# Patient Record
Sex: Female | Born: 1970
Health system: Southern US, Community
[De-identification: ages and names within clinical notes are randomized; demographics above are authoritative.]

## PROBLEM LIST (undated history)

## (undated) DIAGNOSIS — D490 Neoplasm of unspecified behavior of digestive system: Secondary | ICD-10-CM

## (undated) DIAGNOSIS — G43909 Migraine, unspecified, not intractable, without status migrainosus: Secondary | ICD-10-CM

## (undated) DIAGNOSIS — J342 Deviated nasal septum: Secondary | ICD-10-CM

## (undated) DIAGNOSIS — J45998 Other asthma: Secondary | ICD-10-CM

## (undated) DIAGNOSIS — K219 Gastro-esophageal reflux disease without esophagitis: Secondary | ICD-10-CM

## (undated) DIAGNOSIS — R7303 Prediabetes: Secondary | ICD-10-CM

## (undated) DIAGNOSIS — Z9889 Other specified postprocedural states: Secondary | ICD-10-CM

## (undated) DIAGNOSIS — R112 Nausea with vomiting, unspecified: Secondary | ICD-10-CM

## (undated) HISTORY — DX: Prediabetes: R73.03

## (undated) HISTORY — PX: BREAST REDUCTION SURGERY: SHX8

## (undated) HISTORY — DX: Neoplasm of unspecified behavior of digestive system: D49.0

## (undated) HISTORY — DX: Deviated nasal septum: J34.2

---

## 1997-09-02 HISTORY — PX: INGUINAL HERNIA REPAIR: SUR1180

## 1999-09-03 HISTORY — PX: TUBAL LIGATION: SHX77

## 2002-09-02 HISTORY — PX: HAND SURGERY: SHX662

## 2007-07-08 ENCOUNTER — Encounter: Admission: RE | Admit: 2007-07-08 | Discharge: 2007-07-08 | Payer: Self-pay | Admitting: Obstetrics and Gynecology

## 2007-07-28 ENCOUNTER — Encounter: Admission: RE | Admit: 2007-07-28 | Discharge: 2007-07-28 | Payer: Self-pay | Admitting: Obstetrics and Gynecology

## 2007-09-03 HISTORY — PX: ENDOMETRIAL ABLATION: SHX621

## 2008-02-17 ENCOUNTER — Encounter: Admission: RE | Admit: 2008-02-17 | Discharge: 2008-02-17 | Payer: Self-pay | Admitting: Obstetrics and Gynecology

## 2008-05-11 ENCOUNTER — Encounter (INDEPENDENT_AMBULATORY_CARE_PROVIDER_SITE_OTHER): Payer: Self-pay | Admitting: Obstetrics and Gynecology

## 2008-05-11 ENCOUNTER — Ambulatory Visit (HOSPITAL_COMMUNITY): Admission: RE | Admit: 2008-05-11 | Discharge: 2008-05-11 | Payer: Self-pay | Admitting: Obstetrics and Gynecology

## 2009-02-23 ENCOUNTER — Encounter: Admission: RE | Admit: 2009-02-23 | Discharge: 2009-02-23 | Payer: Self-pay | Admitting: Obstetrics and Gynecology

## 2011-01-15 NOTE — Op Note (Signed)
NAMESHANINA, KEPPLE               ACCOUNT NO.:  [id number]   MEDICAL RECORD NO.:  [id number]          PATIENT TYPE:  AMB   LOCATION:  SDC                           FACILITY:  [facility]   PHYSICIAN:  Miguel Aschoff, M.D.       DATE OF BIRTH:  1970-09-28   DATE OF PROCEDURE:  DATE OF DISCHARGE:                               OPERATIVE REPORT   PREOPERATIVE DIAGNOSIS:  Menorrhagia.   POSTOPERATIVE DIAGNOSIS:  Menorrhagia.   PROCEDURES:  Cervical dilatation, hysteroscopy, and uterine curettage  followed by NovaSure endometrial ablation.   SURGEON:  Miguel Aschoff, MD   ANESTHESIA:  General.   COMPLICATIONS:  None.   JUSTIFICATION:  The patient is a 40 year old white female with a history  of very heavy menses, history of prior tubal sterilization.  Because of  the very heavy menses, the patient has requested that a procedure to be  carried out in effort to control her flow.  Options were discussed with  the patient and the various therapies available including Mirena IUD.  She has elected; however, to undergo D&C, hysteroscopy, and endometrial  ablation as primary therapy for her menorrhagia.  Risks and benefits  have been discussed.   PROCEDURE:  The patient was taken to the operating placed in supine  position.  General anesthesia was administered without difficulty.  She  was then placed in dorsal lithotomy position, prepped and draped in the  usual sterile fashion.  Examination under anesthesia revealed normal  external genitalia, normal Bartholin Skene's glands and normal urethra.  The vaginal vault was without gross lesion.  The cervix was without  gross lesion.  The uterus noted be top normal size anterior, no adnexal  masses were noted.  At this point, speculum was placed in the vaginal  vault.  Anterior cervical lip was grasped with the tenaculum.  The  uterus was then sounded to 10 cm.  Following this, the cervical length  of 4 cm was then determined.  At this point, the cervix was  further  dilated using serial Pratt dilators until a #23 Pratt dilator could be  passed.  At this point, the diagnostic hysteroscope was advanced through  the endocervix.  No endocervical lesions were noted.  The endometrial  cavity was unremarkable.  No polyps or submucous myomas were found.  At  this point, one additional dilator was used, a Nutritional therapist 25 and then the  NovaSure endometrial ablation unit was placed into the uterine cavity.  The cavity was 6 cm, the cavity width was 3.7 cm.  A treatment cycle at  122 watts for 117 seconds was carried out without difficulty.  On  completion of the procedure, the instrument was removed intact.  The  scope was reintroduced into the uterus.  The cavity appeared to be well  coagulated.  At this point, the hysteroscope was removed.  The cervix  was injected with 18 mL of 1% Xylocaine by placing equal amounts at 12,  4, and 8 o'clock positions.  Hemostasis was readily achieved.  At this  point, the procedure was completed.  The patient was taken  out of the  lithotomy position, brought to recovery room in satisfactory condition.  Estimated blood loss was minimal.   Plan is for the patient be discharged home.   MEDICATIONS:  For home include, doxycycline one twice a day x3 days and  Darvocet-N 100 every 4 hours need for pain.  She was instructed to place  nothing the vagina, to call if there are any problems such as fever,  pain, or heavy bleeding.      Miguel Aschoff, M.D.  Electronically Signed     AR/MEDQ  D:  05/11/2008  T:  05/11/2008  Job:  [id number]

## 2011-06-05 LAB — CBC
HCT: 39.3
Hemoglobin: 13.1
MCHC: 33.3
MCV: 93.1
RBC: 4.22
WBC: 5.8

## 2014-04-11 ENCOUNTER — Emergency Department (HOSPITAL_COMMUNITY): Payer: 59

## 2014-04-11 ENCOUNTER — Emergency Department (HOSPITAL_COMMUNITY)
Admission: EM | Admit: 2014-04-11 | Discharge: 2014-04-11 | Discharge status: Against Medical Advice | Payer: 59 | Source: Home / Self Care

## 2014-04-11 ENCOUNTER — Encounter (HOSPITAL_COMMUNITY): Payer: Self-pay | Admitting: Emergency Medicine

## 2014-04-11 DIAGNOSIS — R5381 Other malaise: Secondary | ICD-10-CM

## 2014-04-11 DIAGNOSIS — J309 Allergic rhinitis, unspecified: Secondary | ICD-10-CM

## 2014-04-11 DIAGNOSIS — B9789 Other viral agents as the cause of diseases classified elsewhere: Secondary | ICD-10-CM

## 2014-04-11 DIAGNOSIS — R5383 Other fatigue: Secondary | ICD-10-CM

## 2014-04-11 DIAGNOSIS — B349 Viral infection, unspecified: Secondary | ICD-10-CM

## 2014-04-11 HISTORY — DX: Migraine, unspecified, not intractable, without status migrainosus: G43.909

## 2014-04-11 NOTE — Discharge Instructions (Signed)
Fatigue Fatigue is a feeling of tiredness, lack of energy, lack of motivation, or feeling tired all the time. Having enough rest, good nutrition, and reducing stress will normally reduce fatigue. Consult your caregiver if it persists. The nature of your fatigue will help your caregiver to find out its cause. The treatment is based on the cause.  CAUSES  There are many causes for fatigue. Most of the time, fatigue can be traced to one or more of your habits or routines. Most causes fit into one or more of three general areas. They are: Lifestyle problems  Sleep disturbances.  Overwork.  Physical exertion.  Unhealthy habits.  Poor eating habits or eating disorders.  Alcohol and/or drug use .  Lack of proper nutrition (malnutrition). Psychological problems  Stress and/or anxiety problems.  Depression.  Grief.  Boredom. Medical Problems or Conditions  Anemia.  Pregnancy.  Thyroid gland problems.  Recovery from major surgery.  Continuous pain.  Emphysema or asthma that is not well controlled  Allergic conditions.  Diabetes.  Infections (such as mononucleosis).  Obesity.  Sleep disorders, such as sleep apnea.  Heart failure or other heart-related problems.  Cancer.  Kidney disease.  Liver disease.  Effects of certain medicines such as antihistamines, cough and cold remedies, prescription pain medicines, heart and blood pressure medicines, drugs used for treatment of cancer, and some antidepressants. SYMPTOMS  The symptoms of fatigue include:   Lack of energy.  Lack of drive (motivation).  Drowsiness.  Feeling of indifference to the surroundings. DIAGNOSIS  The details of how you feel help guide your caregiver in finding out what is causing the fatigue. You will be asked about your present and past health condition. It is important to review all medicines that you take, including prescription and non-prescription items. A thorough exam will be done.  You will be questioned about your feelings, habits, and normal lifestyle. Your caregiver may suggest blood tests, urine tests, or other tests to look for common medical causes of fatigue.  TREATMENT  Fatigue is treated by correcting the underlying cause. For example, if you have continuous pain or depression, treating these causes will improve how you feel. Similarly, adjusting the dose of certain medicines will help in reducing fatigue.  HOME CARE INSTRUCTIONS   Try to get the required amount of good sleep every night.  Eat a healthy and nutritious diet, and drink enough water throughout the day.  Practice ways of relaxing (including yoga or meditation).  Exercise regularly.  Make plans to change situations that cause stress. Act on those plans so that stresses decrease over time. Keep your work and personal routine reasonable.  Avoid street drugs and minimize use of alcohol.  Start taking a daily multivitamin after consulting your caregiver. SEEK MEDICAL CARE IF:   You have persistent tiredness, which cannot be accounted for.  You have fever.  You have unintentional weight loss.  You have headaches.  You have disturbed sleep throughout the night.  You are feeling sad.  You have constipation.  You have dry skin.  You have gained weight.  You are taking any new or different medicines that you suspect are causing fatigue.  You are unable to sleep at night.  You develop any unusual swelling of your legs or other parts of your body. SEEK IMMEDIATE MEDICAL CARE IF:   You are feeling confused.  Your vision is blurred.  You feel faint or pass out.  You develop severe headache.  You develop severe abdominal, pelvic, or  back pain.  You develop chest pain, shortness of breath, or an irregular or fast heartbeat.  You are unable to pass a normal amount of urine.  You develop abnormal bleeding such as bleeding from the rectum or you vomit blood.  You have thoughts  about harming yourself or committing suicide.  You are worried that you might harm someone else. MAKE SURE YOU:   Understand these instructions.  Will watch your condition.  Will get help right away if you are not doing well or get worse. Document Released: 06/16/2007 Document Revised: 11/11/2011 Document Reviewed: 12/21/2013 [facility] Patient Information 2015 [place], [place]. This information is not intended to replace advice given to you by your health care provider. Make sure you discuss any questions you have with your health care provider.  Viral Infections A viral infection can be caused by different types of viruses.Most viral infections are not serious and resolve on their own. However, some infections may cause severe symptoms and may lead to further complications. SYMPTOMS Viruses can frequently cause:  Minor sore throat.  Aches and pains.  Headaches.  Runny nose.  Different types of rashes.  Watery eyes.  Tiredness.  Cough.  Loss of appetite.  Gastrointestinal infections, resulting in nausea, vomiting, and diarrhea. These symptoms do not respond to antibiotics because the infection is not caused by bacteria. However, you might catch a bacterial infection following the viral infection. This is sometimes called a "superinfection." Symptoms of such a bacterial infection may include:  Worsening sore throat with pus and difficulty swallowing.  Swollen neck glands.  Chills and a high or persistent fever.  Severe headache.  Tenderness over the sinuses.  Persistent overall ill feeling (malaise), muscle aches, and tiredness (fatigue).  Persistent cough.  Yellow, green, or brown mucus production with coughing. HOME CARE INSTRUCTIONS   Only take over-the-counter or prescription medicines for pain, discomfort, diarrhea, or fever as directed by your caregiver.  Drink enough water and fluids to keep your urine clear or pale yellow. Sports drinks can provide  valuable electrolytes, sugars, and hydration.  Get plenty of rest and maintain proper nutrition. Soups and broths with crackers or rice are fine. SEEK IMMEDIATE MEDICAL CARE IF:   You have severe headaches, shortness of breath, chest pain, neck pain, or an unusual rash.  You have uncontrolled vomiting, diarrhea, or you are unable to keep down fluids.  You or your child has an oral temperature above 102 F (38.9 C), not controlled by medicine.  Your baby is older than 3 months with a rectal temperature of 102 F (38.9 C) or higher.  Your baby is 10 months old or younger with a rectal temperature of 100.4 F (38 C) or higher. MAKE SURE YOU:   Understand these instructions.  Will watch your condition.  Will get help right away if you are not doing well or get worse. Document Released: 05/29/2005 Document Revised: 11/11/2011 Document Reviewed: 12/24/2010 [facility] Patient Information 2015 [place], [place]. This information is not intended to replace advice given to you by your health care provider. Make sure you discuss any questions you have with your health care provider.

## 2014-04-11 NOTE — ED Notes (Signed)
Patient made aware that another provider would be coming in to see her.

## 2014-04-11 NOTE — ED Provider Notes (Signed)
CSN: [id number]     Arrival date & time 04/11/14  1352 History   First MD Initiated Contact with Patient 04/11/14 1521     Chief Complaint  Patient presents with  . Headache  . Fever   (Consider location/radiation/quality/duration/timing/severity/associated sxs/prior Treatment) Patient is a 43 y.o. female presenting with fever.  Fever Onset quality:  Gradual Duration:  7 days Progression:  Worsening (feels worse today, except migraine on sat/sun has resolved.) Chronicity:  New Associated symptoms: congestion, cough, headaches, myalgias, nausea and sore throat   Associated symptoms: no chest pain, no diarrhea, no dysuria, no rash and no vomiting   Associated symptoms comment:  No ticks, no travel. Risk factors: no recent travel and no sick contacts     Past Medical History  Diagnosis Date  . Asthma   . Migraines    Past Surgical History  Procedure Laterality Date  . Tubal ligation    . Hernia repair     No family history on file. History  Substance Use Topics  . Smoking status: Never Smoker   . Smokeless tobacco: Not on file  . Alcohol Use: No   OB History   Grav Para Term Preterm Abortions TAB SAB Ect Mult Living                 Review of Systems  Constitutional: Positive for fever.  HENT: Positive for congestion and sore throat.   Respiratory: Positive for cough.   Cardiovascular: Negative for chest pain.  Gastrointestinal: Positive for nausea. Negative for vomiting and diarrhea.  Genitourinary: Negative for dysuria.  Musculoskeletal: Positive for myalgias.  Skin: Negative for rash.  Neurological: Positive for headaches.    Allergies  Review of patient's allergies indicates no known allergies.  Home Medications   Prior to Admission medications   Medication Sig Start Date End Date Taking? Authorizing Provider  Naproxen Sodium (ALEVE PO) Take by mouth.   Yes Historical Provider, MD  SUMAtriptan Succinate (IMITREX PO) Take by mouth.   Yes Historical  Provider, MD   BP 100/68  Pulse 109  Temp(Src) 99.3 F (37.4 C) (Oral)  Resp 16  SpO2 95%  LMP 03/24/2014 Physical Exam  ED Course  Procedures (including critical care time) Labs Review Labs Reviewed - No data to display  Imaging Review No results found.   MDM   1. Viral syndrome   2. Malaise and fatigue   3. Allergic rhinitis, unspecified allergic rhinitis type    Pt requested another provider after initial misunderstanding with me re improvement in her sx when she relates she is actually worse.in some ways.. Pt distraught.Billy Fischer, MD 04/11/14 [phone]

## 2014-04-11 NOTE — ED Notes (Signed)
Reports onset of symptoms last Monday-august 3.  Since onset symptoms have worsened and seemed to peak on Saturday-8/8.  Headache, intermittent migraine, chest soreness, slight abdominal pain, nausea, no vomiting, no diarrhea.  Has felt feverish with joint aches and left flank pain

## 2014-04-11 NOTE — ED Provider Notes (Signed)
Medical screening examination/treatment/procedure(s) were performed by resident physician or non-physician practitioner and as supervising physician I was immediately available for consultation/collaboration.   Pauline Good MD.   Billy Fischer, MD 04/11/14 [phone]

## 2014-04-11 NOTE — ED Notes (Signed)
Notified provider that patient upset and requesting another provider if possible.  Agreed to Janne Napoleon, np evaluating patient

## 2014-04-11 NOTE — ED Provider Notes (Signed)
CSN: [id number]     Arrival date & time 04/11/14  1352 History   First MD Initiated Contact with Patient 04/11/14 1521     Chief Complaint  Patient presents with  . Headache  . Fever   (Consider location/radiation/quality/duration/timing/severity/associated sxs/prior Treatment) HPI Comments: 43 y o f with a myriad of sx's including "feeling bad"for several days, weakness subjective fever, 'hard to breath", some joints hurting, chest congestion, PND and clearing of throat.  When entering room she was sitting in chair crying with an exaggerated expression of dispair.    Past Medical History  Diagnosis Date  . Asthma   . Migraines    Past Surgical History  Procedure Laterality Date  . Tubal ligation    . Hernia repair     No family history on file. History  Substance Use Topics  . Smoking status: Never Smoker   . Smokeless tobacco: Not on file  . Alcohol Use: No   OB History   Grav Para Term Preterm Abortions TAB SAB Ect Mult Living                 Review of Systems  Constitutional: Positive for fever, activity change, appetite change and fatigue.  HENT: Positive for congestion and postnasal drip. Negative for ear pain, facial swelling, sinus pressure, sore throat, tinnitus and trouble swallowing.   Eyes: Negative for visual disturbance.  Respiratory: Negative for cough, choking and wheezing.   Cardiovascular: Negative for chest pain and leg swelling.  Gastrointestinal: Positive for nausea. Negative for vomiting.       When she had her migraine headache earlier this weekend.  Genitourinary: Negative.   Musculoskeletal: Positive for arthralgias and back pain. Negative for gait problem, joint swelling, myalgias, neck pain and neck stiffness.  Skin: Negative for rash.  Neurological: Positive for weakness and headaches. Negative for dizziness.  Psychiatric/Behavioral: The patient is nervous/anxious.     Allergies  Review of patient's allergies indicates no known  allergies.  Home Medications   Prior to Admission medications   Medication Sig Start Date End Date Taking? Authorizing Provider  Naproxen Sodium (ALEVE PO) Take by mouth.   Yes Historical Provider, MD  SUMAtriptan Succinate (IMITREX PO) Take by mouth.   Yes Historical Provider, MD   BP 100/68  Pulse 109  Temp(Src) 99.3 F (37.4 C) (Oral)  Resp 16  SpO2 95%  LMP 03/24/2014 Physical Exam  Nursing note and vitals reviewed. Constitutional: She is oriented to person, place, and time. She appears well-developed and well-nourished. She appears distressed.  HENT:  Right Ear: External ear normal.  Left Ear: External ear normal.  Mouth/Throat: No oropharyngeal exudate.  OP with coblestoning and scant clear PND.  Eyes: EOM are normal. Pupils are equal, round, and reactive to light.  Neck: Normal range of motion. Neck supple.  No spinal or paracervical tenderness. FUll rom.  Cardiovascular: Normal rate, regular rhythm, normal heart sounds and intact distal pulses.   No murmur heard. Pulmonary/Chest: Effort normal and breath sounds normal. No respiratory distress. She has no wheezes. She has no rales.  Abdominal: Soft. There is no tenderness.  Musculoskeletal: Normal range of motion. She exhibits no edema and no tenderness.  Lymphadenopathy:    She has no cervical adenopathy.  Neurological: She is alert and oriented to person, place, and time. She exhibits normal muscle tone.  Skin: Skin is warm and dry.  Psychiatric: Her speech is normal. Judgment and thought content normal. Her mood appears anxious. Cognition and memory are  normal. She exhibits a depressed mood.    ED Course  Procedures (including critical care time) Labs Review Labs Reviewed - No data to display  Imaging Review No results found.   MDM   1. Viral syndrome   2. Malaise and fatigue   3. Allergic rhinitis, unspecified allergic rhinitis type    Recommended rest, fluids. Antihistamines for drainage Motrin for  achiness Try your albuterol HFA for any shortness of breath. Pt was asked to wait to receive instructions but she walked out when I left the room     Janne Napoleon, NP 04/11/14 1625

## 2014-04-11 NOTE — ED Notes (Signed)
Patient walked out of building without speaking to nursing staff

## 2015-02-17 ENCOUNTER — Other Ambulatory Visit: Payer: Self-pay | Admitting: Family Medicine

## 2015-02-17 DIAGNOSIS — Z1231 Encounter for screening mammogram for malignant neoplasm of breast: Secondary | ICD-10-CM

## 2015-02-23 ENCOUNTER — Ambulatory Visit: Payer: Self-pay

## 2015-08-24 ENCOUNTER — Ambulatory Visit
Admission: RE | Admit: 2015-08-24 | Discharge: 2015-08-24 | Disposition: A | Discharge status: Home / Self Care | Payer: Commercial Managed Care - HMO | Source: Ambulatory Visit | Attending: Family Medicine | Admitting: Family Medicine

## 2015-08-24 DIAGNOSIS — Z1231 Encounter for screening mammogram for malignant neoplasm of breast: Secondary | ICD-10-CM

## 2015-08-29 ENCOUNTER — Other Ambulatory Visit: Payer: Self-pay | Admitting: Family Medicine

## 2015-08-29 DIAGNOSIS — R928 Other abnormal and inconclusive findings on diagnostic imaging of breast: Secondary | ICD-10-CM

## 2015-09-07 ENCOUNTER — Ambulatory Visit
Admission: RE | Admit: 2015-09-07 | Discharge: 2015-09-07 | Disposition: A | Discharge status: Home / Self Care | Payer: Commercial Managed Care - HMO | Source: Ambulatory Visit | Attending: Family Medicine | Admitting: Family Medicine

## 2015-09-07 DIAGNOSIS — R928 Other abnormal and inconclusive findings on diagnostic imaging of breast: Secondary | ICD-10-CM

## 2016-01-17 ENCOUNTER — Other Ambulatory Visit: Payer: Self-pay | Admitting: Family Medicine

## 2016-01-17 DIAGNOSIS — R221 Localized swelling, mass and lump, neck: Secondary | ICD-10-CM

## 2016-01-19 ENCOUNTER — Ambulatory Visit
Admission: RE | Admit: 2016-01-19 | Discharge: 2016-01-19 | Disposition: A | Discharge status: Home / Self Care | Payer: 59 | Source: Ambulatory Visit | Attending: Family Medicine | Admitting: Family Medicine

## 2016-01-19 DIAGNOSIS — R221 Localized swelling, mass and lump, neck: Secondary | ICD-10-CM

## 2016-02-01 ENCOUNTER — Other Ambulatory Visit: Payer: Self-pay | Admitting: Family Medicine

## 2016-02-01 DIAGNOSIS — K118 Other diseases of salivary glands: Secondary | ICD-10-CM

## 2016-02-08 ENCOUNTER — Other Ambulatory Visit: Payer: 59

## 2016-02-23 ENCOUNTER — Inpatient Hospital Stay: Admission: RE | Admit: 2016-02-23 | Payer: 59 | Source: Ambulatory Visit

## 2016-02-29 ENCOUNTER — Ambulatory Visit
Admission: RE | Admit: 2016-02-29 | Discharge: 2016-02-29 | Disposition: A | Discharge status: Home / Self Care | Payer: 59 | Source: Ambulatory Visit | Attending: Family Medicine | Admitting: Family Medicine

## 2016-02-29 DIAGNOSIS — K118 Other diseases of salivary glands: Secondary | ICD-10-CM

## 2016-02-29 MED ORDER — IOPAMIDOL (ISOVUE-300) INJECTION 61%
75.0000 mL | Freq: Once | INTRAVENOUS | Status: AC | PRN
  Administered 2016-02-29: 75 mL via INTRAVENOUS

## 2016-05-21 ENCOUNTER — Ambulatory Visit (INDEPENDENT_AMBULATORY_CARE_PROVIDER_SITE_OTHER): Payer: 59 | Admitting: Allergy and Immunology

## 2016-05-21 ENCOUNTER — Encounter: Payer: Self-pay | Admitting: Allergy and Immunology

## 2016-05-21 ENCOUNTER — Encounter (INDEPENDENT_AMBULATORY_CARE_PROVIDER_SITE_OTHER): Payer: Self-pay

## 2016-05-21 VITALS — BP 104/70 | HR 80 | Temp 98.1°F | Resp 16 | Ht 63.7 in | Wt 153.6 lb

## 2016-05-21 DIAGNOSIS — J309 Allergic rhinitis, unspecified: Secondary | ICD-10-CM | POA: Diagnosis not present

## 2016-05-21 DIAGNOSIS — J387 Other diseases of larynx: Secondary | ICD-10-CM

## 2016-05-21 DIAGNOSIS — H1010 Acute atopic conjunctivitis, unspecified eye: Secondary | ICD-10-CM

## 2016-05-21 DIAGNOSIS — G43909 Migraine, unspecified, not intractable, without status migrainosus: Secondary | ICD-10-CM

## 2016-05-21 DIAGNOSIS — J4520 Mild intermittent asthma, uncomplicated: Secondary | ICD-10-CM | POA: Diagnosis not present

## 2016-05-21 DIAGNOSIS — K219 Gastro-esophageal reflux disease without esophagitis: Secondary | ICD-10-CM

## 2016-05-21 DIAGNOSIS — G43109 Migraine with aura, not intractable, without status migrainosus: Secondary | ICD-10-CM

## 2016-05-21 MED ORDER — DEXLANSOPRAZOLE 60 MG PO CPDR: DELAYED_RELEASE_CAPSULE | ORAL | 5 refills | Status: DC

## 2016-05-21 MED ORDER — RANITIDINE HCL 300 MG PO TABS: 300.0000 mg | ORAL_TABLET | Freq: Every day | ORAL | 5 refills | Status: DC

## 2016-05-21 MED ORDER — BECLOMETHASONE DIPROPIONATE 80 MCG/ACT NA AERS: INHALATION_SPRAY | NASAL | 5 refills | Status: DC

## 2016-05-21 MED ORDER — OLOPATADINE HCL 0.7 % OP SOLN: 1.0000 [drp] | Freq: Every day | OPHTHALMIC | 5 refills | Status: DC | PRN

## 2016-05-21 NOTE — Patient Instructions (Addendum)
  1. Allergen avoidance measures  2. Treat and prevent inflammation:   A. Qnasl 80 - 1 puff each nostril one-2 times per day  3. Treat and prevent LPR:   A. replaced throat clearing with swallowing maneuver  B. slowly taper off all caffeine  C. Dexilant 60 one tablet in AM  D. ranitidine 300 one tablet in PM  4. Treat and prevent headache:   A. slowly taper off caffeine  B. taper off all analgesics  3. If needed:   A. nasal saline spray  B. OTC antihistamine - Claritin/Zyrtec/Allegra  C. Pazeo one drop each eye once a day  D. Ventolin Hfa 2 puffs every 4-6 hours  4. Rosacea?  5. Immunotherapy?  6. Return to clinic in 4 weeks or earlier if problem

## 2016-05-21 NOTE — Progress Notes (Signed)
Dear Dr. Wilburn Cornelia,  Thank you for referring Bonnie Lester to the [facility] on 05/21/2016.   Below is a summation of this patient's evaluation and recommendations.  Thank you for your referral. I will keep you informed about this patient's response to treatment.   If you have any questions please to do hesitate to contact me.   Sincerely,  Jiles Prows, MD [place]   ______________________________________________________________________    NEW PATIENT NOTE  Referring Provider: Jerrell Belfast, MD Primary Provider: Rachell Cipro, MD Date of office visit: 05/21/2016    Subjective:   Chief Complaint:  Bonnie Lester (DOB: [birth year]) is a 45 y.o. female who presents to the clinic on 05/21/2016 with a chief complaint of Shortness of Breath and Allergic Rhinitis  .     HPI: Bonnie Lester presents to this clinic in evaluation of several distinct issues.  First, she has a long history of allergic rhinoconjunctivitis manifested as nasal congestion and sneezing and itchy eyes usually following exposure to dust and locating to the outdoors and triggers such as strong smells and perfumes. She does not have any anosmia or decreased ability to taste. She has had evaluation by Dr. Wilburn Cornelia for a left parotid gland tumor who performed a CT scan of her sinuses which did not identify any sinusitis but did identify a deviated septum. She is scheduled to have this deviated septum repaired in December. She has tried multiple medications in the past which she feels have not really helped her very much. She has used Dymista and Singulair which have not really helped her.  Second, she has headaches. She has 2 forms of headaches. She has a dull headache that occurs in her frontal region and is constant. She takes Aleve every day for this headache. About one time per week this will transform into a migraine for  which she will take Imitrex. She drinks one cup of coffee per day. She does not believe that her sleep is disturbed and she does not have any symptoms suggesting significant apneic issues at night. Provoking factors for this headache include exposure to strong smells. She was on Topamax in the past but she had to discontinue this secondary to some issues with possible sedation and possibly triggering off some asthma.  Third, she does have a history of asthma that has improved as she has aged. She rarely uses a short acting bronchodilator at this point in time. She does not have any cold air induced bronchospastic symptoms. Sometimes she will get short of breath if she exerts herself to any large degree.  Fourth, Bonnie Lester has constant tickle in her throat. She does a lot of throat clearing. 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 She does get intermittent raspy voice. Several years ago she was evaluated by a ENT who performed rhinoscopy and felt that she had LPR and treated her with antireflux medicines for one year which she does not think helped. As noted above she does drink one coffee per day.  Past Medical History:  Diagnosis Date  . Asthma   . Deviated septum   . Migraines   . Parotid tumor     Past Surgical History:  Procedure Laterality Date  . ENDOMETRIAL ABLATION  2009  . HAND SURGERY  2004  . HERNIA REPAIR  1999  . TUBAL LIGATION  2001      Medication List  ALEVE PO Take by mouth.   cetirizine 10 MG tablet Commonly known as:  ZYRTEC Take 10 mg by mouth.   SUMAtriptan 50 MG tablet Commonly known as:  IMITREX Take by mouth.   VENTOLIN HFA 108 (90 Base) MCG/ACT inhaler Generic drug:  albuterol Inhale two puffs every four to six hours as needed for cough or wheeze.       No Known Allergies  Review of systems negative except as noted in HPI / PMHx or noted below:  Review of Systems  Constitutional: Negative.     HENT: Negative.   Eyes: Negative.   Respiratory: Negative.   Cardiovascular: Negative.   Gastrointestinal: Negative.   Genitourinary: Negative.   Musculoskeletal: Negative.   Skin: Negative.   Neurological: Negative.   Endo/Heme/Allergies: Negative.   Psychiatric/Behavioral: Negative.     Family History  Problem Relation Age of Onset  . Depression Father   . Depression Sister   . Cancer Maternal Grandmother     Social History   Social History  . Marital status: Married    Spouse name: N/A  . Number of children: N/A  . Years of education: N/A   Occupational History  . Not on file.   Social History Main Topics  . Smoking status: Never Smoker  . Smokeless tobacco: Never Used  . Alcohol use No  . Drug use: No  . Sexual activity: Not on file   Other Topics Concern  . Not on file   Social History Narrative  . No narrative on file    Environmental and Social history  Lives in a townhouse with a dry environment, a dog located inside the household, carpeting in the bedroom, no plastic on the bed or pillow, and no smoking ongoing with inside the household. She works in an office setting as a Music therapist.   Objective:   Vitals:   05/21/16 0819  BP: 104/70  Pulse: 80  Resp: 16  Temp: 98.1 F (36.7 C)   Height: 5' 3.7" (161.8 cm) Weight: 153 lb 9.6 oz (69.7 kg)  Physical Exam  Constitutional: She is well-developed, well-nourished, and in no distress.  Allergic shiners  HENT:  Head: Normocephalic.  Right Ear: Tympanic membrane, external ear and ear canal normal.  Left Ear: Tympanic membrane, external ear and ear canal normal.  Nose: Mucosal edema present. No rhinorrhea.  Mouth/Throat: Uvula is midline, oropharynx is clear and moist and mucous membranes are normal. No oropharyngeal exudate.  Eyes: Conjunctivae are normal.  Neck: Trachea normal. No tracheal tenderness present. No tracheal deviation present. No thyromegaly present.  Cardiovascular:  Normal rate, regular rhythm, S1 normal, S2 normal and normal heart sounds.   No murmur heard. Pulmonary/Chest: Breath sounds normal. No stridor. No respiratory distress. She has no wheezes. She has no rales.  Musculoskeletal: She exhibits no edema.  Lymphadenopathy:       Head (right side): No tonsillar adenopathy present.       Head (left side): No tonsillar adenopathy present.    She has no cervical adenopathy.  Neurological: She is alert. Gait normal.  Skin: Rash (Slight facial erythema with telangiectasia and small cystic lesions) noted. She is not diaphoretic. No erythema. Nails show no clubbing.  Psychiatric: Mood and affect normal.    Diagnostics: Allergy skin tests were performed. She demonstrated hypersensitivity against house dust mite and cat  Spirometry was performed and demonstrated an FEV1 of 2.37 @ 88 % of predicted.    Assessment and Plan:    1.  Allergic rhinoconjunctivitis   2. Migraine syndrome   3. LPRD (laryngopharyngeal reflux disease)   4. Asthma, mild intermittent, well-controlled     1. Allergen avoidance measures  2. Treat and prevent inflammation:   A. Qnasl 80 - 1 puff each nostril one-2 times per day  3. Treat and prevent LPR:   A. replaced throat clearing with swallowing maneuver  B. slowly taper off all caffeine  C. Dexilant 60 one tablet in AM  D. ranitidine 300 one tablet in PM  4. Treat and prevent headache:   A. slowly taper off caffeine  B. taper off all analgesics  3. If needed:   A. nasal saline spray  B. OTC antihistamine - Claritin/Zyrtec/Allegra  C. Pazeo one drop each eye once a day  D. Ventolin Hfa 2 puffs every 4-6 hours  4. Rosacea?  5. Immunotherapy?  6. Return to clinic in 4 weeks or earlier if problem  Bonnie Lester certainly appears to have some immunological hyperreactivity manifested as allergic rhinoconjunctivitis and very mild intermittent atopic dermatitis for which we'll get her to perform allergen avoidance  measures and use the anti-inflammatory medications noted above. As well, her history is quite consistent with LPR and we'll have her aggressively treat reflux as noted above. Her chronic daily headaches with migraines should respond somewhat to tapering off all caffeine and tapering off her analgesics to eliminate the medication-induced headache syndrome she's presently experiencing. Some of her eye issue may not be tied up altogether with allergic disease and she may have a component of rosacea that may need to be treated if she does not have a good response therapy mentioned above directed against her atopic disease. She would definitely be a candidate for immunotherapy if she fails medical treatment. I'll regroup with her in 4 weeks.  Jiles Prows, MD [facility]

## 2016-06-18 ENCOUNTER — Encounter: Payer: Self-pay | Admitting: Allergy and Immunology

## 2016-06-18 ENCOUNTER — Ambulatory Visit (INDEPENDENT_AMBULATORY_CARE_PROVIDER_SITE_OTHER): Payer: 59 | Admitting: Allergy and Immunology

## 2016-06-18 VITALS — BP 116/80 | HR 68 | Resp 16

## 2016-06-18 DIAGNOSIS — J4520 Mild intermittent asthma, uncomplicated: Secondary | ICD-10-CM | POA: Diagnosis not present

## 2016-06-18 DIAGNOSIS — G43909 Migraine, unspecified, not intractable, without status migrainosus: Secondary | ICD-10-CM | POA: Diagnosis not present

## 2016-06-18 DIAGNOSIS — K219 Gastro-esophageal reflux disease without esophagitis: Secondary | ICD-10-CM

## 2016-06-18 DIAGNOSIS — H1010 Acute atopic conjunctivitis, unspecified eye: Secondary | ICD-10-CM | POA: Diagnosis not present

## 2016-06-18 DIAGNOSIS — J309 Allergic rhinitis, unspecified: Secondary | ICD-10-CM

## 2016-06-18 NOTE — Patient Instructions (Signed)
  1. Continue to perform Allergen avoidance measures  2. Continue to Treat and prevent inflammation:   A. Qnasl 80 - 1 puff each nostril 3-7 times per week  3. Continue to Treat and prevent LPR:   A. replaced throat clearing with swallowing maneuver  B. slowly taper off all caffeine  C. ranitidine 300 one tablet in PM  4. Continue to Treat and prevent headache:   A. slowly taper off caffeine  B. taper off all analgesics  3. If needed:   A. nasal saline spray  B. OTC antihistamine - Claritin/Zyrtec/Allegra  C. Pazeo one drop each eye once a day  D. Ventolin Hfa 2 puffs every 4-6 hours  4. Return to clinic in January 2018 or earlier if problem

## 2016-06-18 NOTE — Progress Notes (Signed)
Follow-up Note  Referring Provider: Fanny Bien, MD Primary Provider: Rachell Cipro, MD Date of Office Visit: 06/18/2016  Subjective:   Bonnie Lester (DOB: 24-Aug-1971) is a 45 y.o. female who returns to the [facility] on 06/18/2016 in re-evaluation of the following:  HPI: Bonnie Lester returns to this clinic in reevaluation of her allergic rhinoconjunctivitis, mild intermittent asthma, LPR, and migraine syndrome. I last saw her in his clinic during her initial evaluation of 05/21/2016.  She is doing much better regarding each one of her issues. She has very little problems with her airways at this point. Her headache frequency has decreased dramatically and she went a whole week without any headache. She's used Imitrex once in the past 8 days. She has decreased her caffeine dramatically although she still continues to drink a small amount in the morning. She has plans to taper off this medication over the course the next several weeks. Her asthma has not been active and she's not had to use a short acting bronchodilator. Her throat is much better at this point in time. She has continued to use Qnasl and has relied on the use of ranitidine without any Dexilant to treat her reflux. She has performed house dust mite avoidance measures.    Medication List      B-2 PO Take by mouth.   Beclomethasone Dipropionate 80 MCG/ACT Aers Commonly known as:  QNASL Use one puff in each nostril one to two times a day as directed.   cetirizine 10 MG tablet Commonly known as:  ZYRTEC Take 10 mg by mouth.   COQ-10 PO Take by mouth.   IMITREX 100 MG tablet Generic drug:  SUMAtriptan Take 100 mg by mouth as needed for migraine. May repeat in 2 hours if headache persists or recurs.   MAGNESIUM PO Take by mouth.   Olopatadine HCl 0.7 % Soln Commonly known as:  PAZEO Apply 1 drop to eye daily as needed. for itchy eyes.   ranitidine 300 MG tablet Commonly known as:   ZANTAC Take 1 tablet (300 mg total) by mouth at bedtime.   VENTOLIN HFA 108 (90 Base) MCG/ACT inhaler Generic drug:  albuterol Inhale two puffs every four to six hours as needed for cough or wheeze.   VITAMIN D PO Take by mouth daily.       Past Medical History:  Diagnosis Date  . Asthma   . Deviated septum   . Migraines   . Parotid tumor     Past Surgical History:  Procedure Laterality Date  . ENDOMETRIAL ABLATION  2009  . HAND SURGERY  2004  . HERNIA REPAIR  1999  . TUBAL LIGATION  2001    No Known Allergies  Review of systems negative except as noted in HPI / PMHx or noted below:  Review of Systems  Constitutional: Negative.   HENT: Negative.   Eyes: Negative.   Respiratory: Negative.   Cardiovascular: Negative.   Gastrointestinal: Negative.   Genitourinary: Negative.   Musculoskeletal: Negative.   Skin: Negative.   Neurological: Negative.   Endo/Heme/Allergies: Negative.   Psychiatric/Behavioral: Negative.      Objective:   Vitals:   06/18/16 0810  BP: 116/80  Pulse: 68  Resp: 16          Physical Exam  Constitutional: She is well-developed, well-nourished, and in no distress.  HENT:  Head: Normocephalic.  Right Ear: Tympanic membrane, external ear and ear canal normal.  Left Ear: Tympanic membrane, external ear  and ear canal normal.  Nose: Nose normal. No mucosal edema or rhinorrhea.  Mouth/Throat: Uvula is midline, oropharynx is clear and moist and mucous membranes are normal. No oropharyngeal exudate.  Eyes: Conjunctivae are normal.  Neck: Trachea normal. No tracheal tenderness present. No tracheal deviation present. No thyromegaly present.  Cardiovascular: Normal rate, regular rhythm, S1 normal, S2 normal and normal heart sounds.   No murmur heard. Pulmonary/Chest: Breath sounds normal. No stridor. No respiratory distress. She has no wheezes. She has no rales.  Musculoskeletal: She exhibits no edema.  Lymphadenopathy:       Head  (right side): No tonsillar adenopathy present.       Head (left side): No tonsillar adenopathy present.    She has no cervical adenopathy.  Neurological: She is alert. Gait normal.  Skin: No rash noted. She is not diaphoretic. No erythema. Nails show no clubbing.  Psychiatric: Mood and affect normal.    Diagnostics:    Spirometry was performed and demonstrated an FEV1 of 2.31 at 85 % of predicted.  The patient had an Asthma Control Test with the following results: ACT Total Score: 24.    Assessment and Plan:   1. Allergic rhinoconjunctivitis   2. LPRD (laryngopharyngeal reflux disease)   3. Asthma, mild intermittent, well-controlled   4. Migraine syndrome     1. Continue to perform Allergen avoidance measures  2. Continue to Treat and prevent inflammation:   A. Qnasl 80 - 1 puff each nostril 3-7 times per week  3. Continue to Treat and prevent LPR:   A. replaced throat clearing with swallowing maneuver  B. slowly taper off all caffeine  C. ranitidine 300 one tablet in PM  4. Continue to Treat and prevent headache:   A. slowly taper off caffeine  B. taper off all analgesics  3. If needed:   A. nasal saline spray  B. OTC antihistamine - Claritin/Zyrtec/Allegra  C. Pazeo one drop each eye once a day  D. Ventolin Hfa 2 puffs every 4-6 hours  4. Return to clinic in January 2018 or earlier if problem  Bonnie Lester appears to be doing very well at this point in time and this is probably a reflection of her performing house dust avoidance measures, minimizing caffeine use, and consistently using anti-inflammatory medications for her respiratory tract and therapy directed against reflux. She has the option of tapering down her Qnasl aiming for the least amount of medication that controls her upper airway issue and I've encouraged her once again to taper off her caffeine very slowly why she continues to use therapy directed against reflux with the use of an H2 receptor blocker. I will  assume she'll do well and see her back in this clinic in January 2018 or earlier if there is a problem.  Allena Katz, MD [place]

## 2016-06-27 ENCOUNTER — Inpatient Hospital Stay (HOSPITAL_COMMUNITY): Admission: RE | Admit: 2016-06-27 | Payer: 59 | Source: Ambulatory Visit

## 2016-07-09 ENCOUNTER — Other Ambulatory Visit: Payer: Self-pay | Admitting: Otolaryngology

## 2016-07-12 NOTE — Pre-Procedure Instructions (Addendum)
Alecia Vanderwoude  07/12/2016      [id number] - [address], [address] [place] [address] Phone: [phone] Fax: [phone]  [facility] - [address], [address] [place] [address] Phone: [phone] Fax: [phone]    Your procedure is scheduled on November 15  Report to [facility] at 0800 A.M.  Call this number if you have problems the morning of surgery:  [phone]   Remember:  Do not eat food or drink liquids after midnight.   Take these medicines the morning of surgery with A SIP OF WATER albuterol (VENTOLIN HFA)bring with you, Beclomethasone Dipropionate (QNASL), cetirizine (ZYRTEC), eye drops if needed,   Starting TODAY STOP taking any Aspirin, Aleve, Naproxen, Ibuprofen, Motrin, Advil, Goody's, BC's, all herbal medications, fish oil, and all vitamins( including magnesium,vit D, riboflavin)    Do not wear jewelry, make-up or nail polish.  Do not wear lotions, powders, or perfumes, or deoderant.  Do not shave 48 hours prior to surgery.    Do not bring valuables to the hospital.  [facility] is not responsible for any belongings or valuables.  Contacts, dentures or bridgework may not be worn into surgery.  Leave your suitcase in the car.  After surgery it may be brought to your room.  For patients admitted to the hospital, discharge time will be determined by your treatment team.  Patients discharged the day of surgery will not be allowed to drive home.    Special instructions:   [facility]- Preparing For Surgery  Before surgery, you can play an important role. Because skin is not sterile, your skin needs to be as free of germs as possible. You can reduce the number of germs on your skin by washing with CHG (chlorahexidine gluconate) Soap before surgery.   CHG is an antiseptic cleaner which kills germs and bonds with the skin to continue killing germs even after washing.  Please do not use if you have an allergy to CHG or antibacterial soaps. If your skin becomes reddened/irritated stop using the CHG.  Do not shave (including legs and underarms) for at least 48 hours prior to first CHG shower. It is OK to shave your face.  Please follow these instructions carefully.   1. Shower the NIGHT BEFORE SURGERY and the MORNING OF SURGERY with CHG.   2. If you chose to wash your hair, wash your hair first as usual with your normal shampoo.  3. After you shampoo, rinse your hair and body thoroughly to remove the shampoo.  4. Use CHG as you would any other liquid soap. You can apply CHG directly to the skin and wash gently with a scrungie or a clean washcloth.   5. Apply the CHG Soap to your body ONLY FROM THE NECK DOWN.  Do not use on open wounds or open sores. Avoid contact with your eyes, ears, mouth and genitals (private parts). Wash genitals (private parts) with your normal soap.  6. Wash thoroughly, paying special attention to the area where your surgery will be performed.  7. Thoroughly rinse your body with warm water from the neck down.  8. DO NOT shower/wash with your normal soap after using and rinsing off the CHG Soap.  9. Pat yourself dry with a CLEAN TOWEL.   10. Wear CLEAN  PAJAMAS   11. Place CLEAN SHEETS on your bed the night of your first shower and DO NOT SLEEP WITH PETS.    Day of Surgery: Do not apply any deodorants/lotions. Please wear clean clothes to the hospital/surgery center.      Please read over the  fact sheets that you were given.

## 2016-07-15 ENCOUNTER — Encounter (HOSPITAL_COMMUNITY)
Admission: RE | Admit: 2016-07-15 | Discharge: 2016-07-15 | Disposition: A | Discharge status: Home / Self Care | Payer: 59 | Source: Ambulatory Visit | Attending: Otolaryngology | Admitting: Otolaryngology

## 2016-07-15 ENCOUNTER — Other Ambulatory Visit: Payer: Self-pay | Admitting: Otolaryngology

## 2016-07-15 ENCOUNTER — Encounter (HOSPITAL_COMMUNITY): Payer: Self-pay

## 2016-07-15 DIAGNOSIS — K219 Gastro-esophageal reflux disease without esophagitis: Secondary | ICD-10-CM | POA: Diagnosis not present

## 2016-07-15 DIAGNOSIS — D110 Benign neoplasm of parotid gland: Secondary | ICD-10-CM | POA: Diagnosis not present

## 2016-07-15 DIAGNOSIS — G43909 Migraine, unspecified, not intractable, without status migrainosus: Secondary | ICD-10-CM | POA: Diagnosis not present

## 2016-07-15 DIAGNOSIS — Z880 Allergy status to penicillin: Secondary | ICD-10-CM | POA: Diagnosis not present

## 2016-07-15 DIAGNOSIS — R221 Localized swelling, mass and lump, neck: Secondary | ICD-10-CM | POA: Diagnosis present

## 2016-07-15 DIAGNOSIS — J45909 Unspecified asthma, uncomplicated: Secondary | ICD-10-CM | POA: Diagnosis not present

## 2016-07-15 HISTORY — DX: Gastro-esophageal reflux disease without esophagitis: K21.9

## 2016-07-15 HISTORY — DX: Nausea with vomiting, unspecified: Z98.890

## 2016-07-15 HISTORY — DX: Other specified postprocedural states: R11.2

## 2016-07-15 LAB — CBC
HCT: 43.2 % (ref 36.0–46.0)
HEMOGLOBIN: 14.9 g/dL (ref 12.0–15.0)
MCH: 31.8 pg (ref 26.0–34.0)
MCHC: 34.5 g/dL (ref 30.0–36.0)
MCV: 92.3 fL (ref 78.0–100.0)
Platelets: 257 10*3/uL (ref 150–400)
RBC: 4.68 MIL/uL (ref 3.87–5.11)
RDW: 13.1 % (ref 11.5–15.5)
WBC: 6.9 10*3/uL (ref 4.0–10.5)

## 2016-07-15 LAB — BASIC METABOLIC PANEL
ANION GAP: 9 (ref 5–15)
BUN: 13 mg/dL (ref 6–20)
CHLORIDE: 104 mmol/L (ref 101–111)
CO2: 25 mmol/L (ref 22–32)
Calcium: 9.7 mg/dL (ref 8.9–10.3)
Creatinine, Ser: 0.82 mg/dL (ref 0.44–1.00)
GFR calc non Af Amer: >60 mL/min (ref >60)
Glucose, Bld: 98 mg/dL (ref 65–99)
Potassium: 4.1 mmol/L (ref 3.5–5.1)
Sodium: 138 mmol/L (ref 135–145)

## 2016-07-15 LAB — HCG, SERUM, QUALITATIVE: PREG SERUM: NEGATIVE

## 2016-07-15 NOTE — Progress Notes (Signed)
Office called by Lucina Mellow re: allergy to pencillin- ancef ordered

## 2016-07-15 NOTE — Progress Notes (Signed)
Office called re: wrong side on permit verbage per patient. It says right and patient states it is left side.  Also pencillin allergy noted and ancef ordered. Dr shoemaker to change antibiotic day of surgery per [facility].Leafy Ro to redo permit in epic.

## 2016-07-16 HISTORY — PX: PAROTIDECTOMY: SUR1003

## 2016-07-16 MED ORDER — CEFAZOLIN SODIUM-DEXTROSE 2-4 GM/100ML-% IV SOLN
2.0000 g | INTRAVENOUS | Status: AC
  Administered 2016-07-17: 2 g via INTRAVENOUS
  Filled 2016-07-16: qty 100

## 2016-07-17 ENCOUNTER — Encounter (HOSPITAL_COMMUNITY)
Admission: RE | Disposition: A | Discharge status: Home / Self Care | Payer: Self-pay | Source: Ambulatory Visit | Attending: Otolaryngology

## 2016-07-17 ENCOUNTER — Ambulatory Visit (HOSPITAL_COMMUNITY): Payer: 59 | Admitting: Certified Registered Nurse Anesthetist

## 2016-07-17 ENCOUNTER — Encounter (HOSPITAL_COMMUNITY): Payer: Self-pay | Admitting: General Practice

## 2016-07-17 ENCOUNTER — Observation Stay (HOSPITAL_COMMUNITY)
Admission: RE | Admit: 2016-07-17 | Discharge: 2016-07-18 | Disposition: A | Discharge status: Home / Self Care | Payer: 59 | Source: Ambulatory Visit | Attending: Otolaryngology | Admitting: Otolaryngology

## 2016-07-17 DIAGNOSIS — D110 Benign neoplasm of parotid gland: Secondary | ICD-10-CM | POA: Diagnosis not present

## 2016-07-17 DIAGNOSIS — K219 Gastro-esophageal reflux disease without esophagitis: Secondary | ICD-10-CM | POA: Insufficient documentation

## 2016-07-17 DIAGNOSIS — K118 Other diseases of salivary glands: Secondary | ICD-10-CM | POA: Diagnosis present

## 2016-07-17 DIAGNOSIS — Z880 Allergy status to penicillin: Secondary | ICD-10-CM | POA: Insufficient documentation

## 2016-07-17 DIAGNOSIS — J45909 Unspecified asthma, uncomplicated: Secondary | ICD-10-CM | POA: Insufficient documentation

## 2016-07-17 DIAGNOSIS — G43909 Migraine, unspecified, not intractable, without status migrainosus: Secondary | ICD-10-CM | POA: Insufficient documentation

## 2016-07-17 HISTORY — PX: PAROTIDECTOMY: SHX2163

## 2016-07-17 HISTORY — DX: Other asthma: J45.998

## 2016-07-17 LAB — CREATININE, SERUM
CREATININE: 0.76 mg/dL (ref 0.44–1.00)
GFR calc Af Amer: >60 mL/min (ref >60)
GFR calc non Af Amer: >60 mL/min (ref >60)

## 2016-07-17 LAB — CBC
HCT: 41.2 % (ref 36.0–46.0)
Hemoglobin: 14.1 g/dL (ref 12.0–15.0)
MCH: 31.7 pg (ref 26.0–34.0)
MCHC: 34.2 g/dL (ref 30.0–36.0)
MCV: 92.6 fL (ref 78.0–100.0)
PLATELETS: 243 10*3/uL (ref 150–400)
RBC: 4.45 MIL/uL (ref 3.87–5.11)
RDW: 12.9 % (ref 11.5–15.5)
WBC: 17.7 10*3/uL — ABNORMAL HIGH (ref 4.0–10.5)

## 2016-07-17 SURGERY — EXCISION, PAROTID GLAND
Anesthesia: General | Laterality: Left

## 2016-07-17 MED ORDER — SODIUM CHLORIDE 0.9 % IJ SOLN
INTRAMUSCULAR | Status: AC
  Filled 2016-07-17: qty 20

## 2016-07-17 MED ORDER — EPHEDRINE SULFATE-NACL 50-0.9 MG/10ML-% IV SOSY
PREFILLED_SYRINGE | INTRAVENOUS | Status: DC | PRN
  Administered 2016-07-17 (×2): 5 mg via INTRAVENOUS

## 2016-07-17 MED ORDER — ARTIFICIAL TEARS OP OINT
TOPICAL_OINTMENT | OPHTHALMIC | Status: AC
  Filled 2016-07-17: qty 3.5

## 2016-07-17 MED ORDER — EPHEDRINE 5 MG/ML INJ
INTRAVENOUS | Status: AC
  Filled 2016-07-17: qty 10

## 2016-07-17 MED ORDER — DEXAMETHASONE SODIUM PHOSPHATE 10 MG/ML IJ SOLN
10.0000 mg | Freq: Once | INTRAMUSCULAR | Status: AC
  Administered 2016-07-17: 10 mg via INTRAVENOUS
  Filled 2016-07-17: qty 1

## 2016-07-17 MED ORDER — ONDANSETRON HCL 4 MG/2ML IJ SOLN
INTRAMUSCULAR | Status: AC
  Filled 2016-07-17: qty 2

## 2016-07-17 MED ORDER — PROMETHAZINE HCL 25 MG/ML IJ SOLN
6.2500 mg | Freq: Once | INTRAMUSCULAR | Status: AC
  Administered 2016-07-17: 6.25 mg via INTRAVENOUS

## 2016-07-17 MED ORDER — FENTANYL CITRATE (PF) 100 MCG/2ML IJ SOLN
INTRAMUSCULAR | Status: DC | PRN
  Administered 2016-07-17 (×2): 50 ug via INTRAVENOUS
  Administered 2016-07-17: 100 ug via INTRAVENOUS

## 2016-07-17 MED ORDER — REMIFENTANIL HCL 2 MG IV SOLR: 0.5000 ug/kg | Freq: Once | INTRAVENOUS | Status: DC

## 2016-07-17 MED ORDER — LIDOCAINE 2% (20 MG/ML) 5 ML SYRINGE
INTRAMUSCULAR | Status: AC
  Filled 2016-07-17: qty 5

## 2016-07-17 MED ORDER — SUMATRIPTAN SUCCINATE 100 MG PO TABS
100.0000 mg | ORAL_TABLET | Freq: Two times a day (BID) | ORAL | Status: DC | PRN
  Administered 2016-07-17 (×2): 100 mg via ORAL
  Filled 2016-07-17 (×2): qty 1

## 2016-07-17 MED ORDER — HYDROCODONE-ACETAMINOPHEN 5-325 MG PO TABS: 1.0000 | ORAL_TABLET | Freq: Four times a day (QID) | ORAL | 0 refills | Status: DC | PRN

## 2016-07-17 MED ORDER — ONDANSETRON HCL 4 MG/2ML IJ SOLN: 4.0000 mg | INTRAMUSCULAR | Status: DC | PRN

## 2016-07-17 MED ORDER — MORPHINE SULFATE (PF) 4 MG/ML IV SOLN
INTRAVENOUS | Status: AC
  Filled 2016-07-17: qty 1

## 2016-07-17 MED ORDER — LIDOCAINE-EPINEPHRINE 1 %-1:100000 IJ SOLN
INTRAMUSCULAR | Status: AC
  Filled 2016-07-17: qty 1

## 2016-07-17 MED ORDER — PROMETHAZINE HCL 25 MG/ML IJ SOLN
INTRAMUSCULAR | Status: AC
  Filled 2016-07-17: qty 1

## 2016-07-17 MED ORDER — PROPOFOL 10 MG/ML IV BOLUS
INTRAVENOUS | Status: DC | PRN
  Administered 2016-07-17: 150 mg via INTRAVENOUS

## 2016-07-17 MED ORDER — ARTIFICIAL TEARS OP OINT
TOPICAL_OINTMENT | OPHTHALMIC | Status: DC | PRN
  Administered 2016-07-17: 1 via OPHTHALMIC

## 2016-07-17 MED ORDER — SODIUM CHLORIDE 0.9 % IV SOLN
INTRAVENOUS | Status: DC | PRN
  Administered 2016-07-17: .05 ug/kg/min via INTRAVENOUS

## 2016-07-17 MED ORDER — MORPHINE SULFATE (PF) 2 MG/ML IV SOLN
2.0000 mg | INTRAVENOUS | Status: DC | PRN
  Administered 2016-07-17: 4 mg via INTRAVENOUS
  Administered 2016-07-17: 2 mg via INTRAVENOUS
  Filled 2016-07-17: qty 1

## 2016-07-17 MED ORDER — ZOLPIDEM TARTRATE 5 MG PO TABS
5.0000 mg | ORAL_TABLET | Freq: Every evening | ORAL | Status: DC | PRN
  Administered 2016-07-17: 5 mg via ORAL
  Filled 2016-07-17: qty 1

## 2016-07-17 MED ORDER — LIDOCAINE-EPINEPHRINE 1 %-1:100000 IJ SOLN
INTRAMUSCULAR | Status: DC | PRN
  Administered 2016-07-17: 4 mL

## 2016-07-17 MED ORDER — LIDOCAINE 2% (20 MG/ML) 5 ML SYRINGE
INTRAMUSCULAR | Status: DC | PRN
  Administered 2016-07-17: 100 mg via INTRAVENOUS

## 2016-07-17 MED ORDER — BACITRACIN ZINC 500 UNIT/GM EX OINT
TOPICAL_OINTMENT | CUTANEOUS | Status: AC
  Filled 2016-07-17: qty 28.35

## 2016-07-17 MED ORDER — MIDAZOLAM HCL 2 MG/2ML IJ SOLN
INTRAMUSCULAR | Status: AC
  Filled 2016-07-17: qty 2

## 2016-07-17 MED ORDER — CHLORHEXIDINE GLUCONATE CLOTH 2 % EX PADS: 6.0000 | MEDICATED_PAD | Freq: Once | CUTANEOUS | Status: DC

## 2016-07-17 MED ORDER — FENTANYL CITRATE (PF) 100 MCG/2ML IJ SOLN
INTRAMUSCULAR | Status: AC
  Filled 2016-07-17: qty 4

## 2016-07-17 MED ORDER — 0.9 % SODIUM CHLORIDE (POUR BTL) OPTIME
TOPICAL | Status: DC | PRN
  Administered 2016-07-17: 1000 mL

## 2016-07-17 MED ORDER — LACTATED RINGERS IV SOLN
INTRAVENOUS | Status: DC
  Administered 2016-07-17: 50 mL/h via INTRAVENOUS

## 2016-07-17 MED ORDER — FAMOTIDINE 10 MG PO TABS
10.0000 mg | ORAL_TABLET | Freq: Every day | ORAL | Status: DC
  Administered 2016-07-17 – 2016-07-18 (×2): 10 mg via ORAL
  Filled 2016-07-17 (×2): qty 1

## 2016-07-17 MED ORDER — SUCCINYLCHOLINE CHLORIDE 200 MG/10ML IV SOSY
PREFILLED_SYRINGE | INTRAVENOUS | Status: AC
  Filled 2016-07-17: qty 10

## 2016-07-17 MED ORDER — PROPOFOL 10 MG/ML IV BOLUS
INTRAVENOUS | Status: AC
  Filled 2016-07-17: qty 20

## 2016-07-17 MED ORDER — MIDAZOLAM HCL 5 MG/5ML IJ SOLN
INTRAMUSCULAR | Status: DC | PRN
Start: 2016-07-17 — End: 2016-07-17
  Administered 2016-07-17: 2 mg via INTRAVENOUS

## 2016-07-17 MED ORDER — PHENYLEPHRINE HCL 10 MG/ML IJ SOLN
INTRAVENOUS | Status: DC | PRN
  Administered 2016-07-17: 25 ug/min via INTRAVENOUS

## 2016-07-17 MED ORDER — SUCCINYLCHOLINE CHLORIDE 200 MG/10ML IV SOSY
PREFILLED_SYRINGE | INTRAVENOUS | Status: DC | PRN
  Administered 2016-07-17: 160 mg via INTRAVENOUS

## 2016-07-17 MED ORDER — ONDANSETRON HCL 4 MG/2ML IJ SOLN
4.0000 mg | INTRAMUSCULAR | Status: DC
  Administered 2016-07-17: 4 mg via INTRAVENOUS
  Filled 2016-07-17: qty 2

## 2016-07-17 MED ORDER — CEFAZOLIN SODIUM 1 G IJ SOLR
INTRAMUSCULAR | Status: AC
  Filled 2016-07-17: qty 10

## 2016-07-17 MED ORDER — LACTATED RINGERS IV SOLN
INTRAVENOUS | Status: DC | PRN
  Administered 2016-07-17: 11:00:00 via INTRAVENOUS

## 2016-07-17 MED ORDER — HYDROCODONE-ACETAMINOPHEN 5-325 MG PO TABS
1.0000 | ORAL_TABLET | ORAL | Status: DC | PRN
  Administered 2016-07-17 – 2016-07-18 (×4): 2 via ORAL
  Filled 2016-07-17 (×4): qty 2

## 2016-07-17 MED ORDER — SODIUM CHLORIDE 0.9 % IV SOLN
0.0125 ug/kg/min | INTRAVENOUS | Status: DC
  Filled 2016-07-17: qty 2000

## 2016-07-17 MED ORDER — ONDANSETRON HCL 4 MG/2ML IJ SOLN
INTRAMUSCULAR | Status: DC | PRN
  Administered 2016-07-17: 4 mg via INTRAVENOUS

## 2016-07-17 MED ORDER — HEPARIN SODIUM (PORCINE) 5000 UNIT/ML IJ SOLN
5000.0000 [IU] | Freq: Three times a day (TID) | INTRAMUSCULAR | Status: DC
  Administered 2016-07-17 – 2016-07-18 (×2): 5000 [IU] via SUBCUTANEOUS
  Filled 2016-07-17 (×2): qty 1

## 2016-07-17 MED ORDER — IBUPROFEN 400 MG PO TABS
400.0000 mg | ORAL_TABLET | Freq: Four times a day (QID) | ORAL | Status: DC | PRN
  Filled 2016-07-17: qty 1

## 2016-07-17 SURGICAL SUPPLY — 44 items
BLADE SURG 15 STRL LF DISP TIS (BLADE) IMPLANT
BLADE SURG 15 STRL SS (BLADE)
CANISTER SUCTION 2500CC (MISCELLANEOUS) ×2 IMPLANT
CLEANER TIP ELECTROSURG 2X2 (MISCELLANEOUS) ×2 IMPLANT
CONT SPEC 4OZ CLIKSEAL STRL BL (MISCELLANEOUS) ×2 IMPLANT
CORDS BIPOLAR (ELECTRODE) ×2 IMPLANT
COVER SURGICAL LIGHT HANDLE (MISCELLANEOUS) ×2 IMPLANT
DECANTER SPIKE VIAL GLASS SM (MISCELLANEOUS) ×2 IMPLANT
DERMABOND ADVANCED (GAUZE/BANDAGES/DRESSINGS) ×1
DERMABOND ADVANCED .7 DNX12 (GAUZE/BANDAGES/DRESSINGS) ×1 IMPLANT
DRAIN JACKSON RD 7FR 3/32 (WOUND CARE) ×2 IMPLANT
DRAIN SNY 10 ROU (WOUND CARE) IMPLANT
DRAPE PROXIMA HALF (DRAPES) ×2 IMPLANT
DRAPE SURG 17X23 STRL (DRAPES) ×2 IMPLANT
DRSG TEGADERM 2-3/8X2-3/4 SM (GAUZE/BANDAGES/DRESSINGS) ×10 IMPLANT
ELECT COATED BLADE 2.86 ST (ELECTRODE) ×2 IMPLANT
ELECT REM PT RETURN 9FT ADLT (ELECTROSURGICAL) ×2
ELECTRODE REM PT RTRN 9FT ADLT (ELECTROSURGICAL) ×1 IMPLANT
EVACUATOR SILICONE 100CC (DRAIN) ×2 IMPLANT
GAUZE SPONGE 4X4 16PLY XRAY LF (GAUZE/BANDAGES/DRESSINGS) ×4 IMPLANT
GLOVE BIOGEL M 7.0 STRL (GLOVE) ×2 IMPLANT
GLOVE BIOGEL PI IND STRL 7.0 (GLOVE) ×1 IMPLANT
GLOVE BIOGEL PI INDICATOR 7.0 (GLOVE) ×1
GLOVE ECLIPSE 6.5 STRL STRAW (GLOVE) ×2 IMPLANT
GLOVE ECLIPSE 7.0 STRL STRAW (GLOVE) ×2 IMPLANT
GOWN STRL REUS W/ TWL LRG LVL3 (GOWN DISPOSABLE) ×4 IMPLANT
GOWN STRL REUS W/TWL LRG LVL3 (GOWN DISPOSABLE) ×4
KIT BASIN OR (CUSTOM PROCEDURE TRAY) ×2 IMPLANT
KIT ROOM TURNOVER OR (KITS) ×2 IMPLANT
NEEDLE HYPO 25GX1X1/2 BEV (NEEDLE) ×2 IMPLANT
NS IRRIG 1000ML POUR BTL (IV SOLUTION) ×2 IMPLANT
PAD ARMBOARD 7.5X6 YLW CONV (MISCELLANEOUS) ×4 IMPLANT
PENCIL BUTTON HOLSTER BLD 10FT (ELECTRODE) ×2 IMPLANT
PROBE NERVBE PRASS .33 (MISCELLANEOUS) ×2 IMPLANT
SHEARS HARMONIC 9CM CVD (BLADE) ×2 IMPLANT
STAPLER VISISTAT 35W (STAPLE) ×2 IMPLANT
SUT ETHILON 3 0 PS 1 (SUTURE) ×2 IMPLANT
SUT SILK 2 0 REEL (SUTURE) ×2 IMPLANT
SUT SILK 2 0 SH CR/8 (SUTURE) ×2 IMPLANT
SUT SILK 3 0 REEL (SUTURE) ×2 IMPLANT
SUT VIC AB 5-0 P-3 18XBRD (SUTURE) ×1 IMPLANT
SUT VIC AB 5-0 P3 18 (SUTURE) ×1
SUT VICRYL 4-0 PS2 18IN ABS (SUTURE) ×2 IMPLANT
TRAY ENT MC OR (CUSTOM PROCEDURE TRAY) ×2 IMPLANT

## 2016-07-17 NOTE — Anesthesia Postprocedure Evaluation (Signed)
Anesthesia Post Note  Patient: Bonnie Lester  Procedure(s) Performed: Procedure(s) (LRB): LEFT PAROTIDECTOMY WITH FACIAL NERVE DISSECTION AND NIMS MONITORING (Left)  Patient location during evaluation: PACU Anesthesia Type: General Level of consciousness: sedated Pain management: satisfactory to patient Vital Signs Assessment: post-procedure vital signs reviewed and stable Respiratory status: spontaneous breathing Cardiovascular status: stable Anesthetic complications: no    Last Vitals:  Vitals:   07/17/16 1350 07/17/16 1411  BP:  (!) 101/52  Pulse: 75 88  Resp: 17 17  Temp: 36.6 C 37 C    Last Pain:  Vitals:   07/17/16 1411  TempSrc: Oral  PainSc:                  Riccardo Dubin

## 2016-07-17 NOTE — H&P (Signed)
Bonnie Lester is an 45 y.o. female.   Chief Complaint: Left parotid mass HPI: Asymptomatic Left parotid mass  Past Medical History:  Diagnosis Date  . Asthma   . Complication of anesthesia   . Deviated septum   . GERD (gastroesophageal reflux disease)   . Migraines   . Parotid tumor   . PONV (postoperative nausea and vomiting)     Past Surgical History:  Procedure Laterality Date  . ENDOMETRIAL ABLATION  2009  . HAND SURGERY Left 2004   nerve repair  . HERNIA REPAIR Right 1999  . TUBAL LIGATION  2001    Family History  Problem Relation Age of Onset  . Depression Father   . Depression Sister   . Cancer Maternal Grandmother    Social History:  reports that she has never smoked. She has never used smokeless tobacco. She reports that she does not drink alcohol or use drugs.  Allergies:  Allergies  Allergen Reactions  . Penicillins Other (See Comments)    Headache & neck pain  Has patient had a PCN reaction causing immediate rash, facial/tongue/throat swelling, SOB or lightheadedness with hypotension: No  Has patient had a PCN reaction causing severe rash involving mucus membranes or skin necrosis: No Has patient had a PCN reaction that required hospitalization No Has patient had a PCN reaction occurring within the last 10 years: No If all of the above answers are "NO", then may proceed with Cephalosporin use.     Medications Prior to Admission  Medication Sig Dispense Refill  . albuterol (VENTOLIN HFA) 108 (90 Base) MCG/ACT inhaler Inhale two puffs every four to six hours as needed for cough or wheeze.    . Beclomethasone Dipropionate (QNASL) 80 MCG/ACT AERS Use one puff in each nostril one to two times a day as directed. (Patient taking differently: Place 1 puff into both nostrils as directed. one to two times a day) 8.7 g 5  . cetirizine (ZYRTEC) 10 MG tablet Take 10 mg by mouth every evening.     . Cholecalciferol (VITAMIN D PO) Take 1 tablet by mouth 2 (two) times  daily.     . Magnesium 200 MG TABS Take 200 mg by mouth 2 (two) times daily.    . naproxen sodium (ANAPROX) 220 MG tablet Take 220-440 mg by mouth 2 (two) times daily as needed (for migraine headache.).    Marland Kitchen Olopatadine HCl (PAZEO) 0.7 % SOLN Apply 1 drop to eye daily as needed. for itchy eyes. 2.5 mL 5  . ranitidine (ZANTAC) 300 MG tablet Take 1 tablet (300 mg total) by mouth at bedtime. 30 tablet 5  . Riboflavin 100 MG TABS Take 100 mg by mouth 2 (two) times daily.    . SUMAtriptan (IMITREX) 100 MG tablet Take 100 mg by mouth 2 (two) times daily as needed for migraine. May repeat in 2 hours if headache persists or recurs.       No results found for this or any previous visit (from the past 48 hour(s)). No results found.  Review of Systems  Constitutional: Negative.   HENT: Negative.   Respiratory: Negative.   Cardiovascular: Negative.     Last menstrual period 07/06/2016. Physical Exam  Constitutional: She appears well-developed.  Neck: Normal range of motion. Neck supple.  Left parotid mass  Cardiovascular: Normal rate.   Respiratory: Effort normal.     Assessment/Plan Adm for Left parotidectomy with NIMS  Cielle Aguila, MD 07/17/2016, 8:45 AM

## 2016-07-17 NOTE — Transfer of Care (Signed)
Immediate Anesthesia Transfer of Care Note  Patient: JAQUELIN RZEPECKI  Procedure(s) Performed: Procedure(s): LEFT PAROTIDECTOMY WITH FACIAL NERVE DISSECTION AND NIMS MONITORING (Left)  Patient Location: PACU  Anesthesia Type:General  Level of Consciousness: awake, alert , oriented and patient cooperative  Airway & Oxygen Therapy: Patient Spontanous Breathing and Patient connected to nasal cannula oxygen  Post-op Assessment: Report given to RN, Post -op Vital signs reviewed and stable and Patient moving all extremities X 4  Post vital signs: Reviewed and stable  Last Vitals:  Vitals:   07/17/16 0915  BP: 104/69  Pulse: 69  Resp: 20  Temp: 36.7 C    Last Pain:  Vitals:   07/17/16 0909  PainSc: 5       Patients Stated Pain Goal: 5 ([id number])  Complications: No apparent anesthesia complications

## 2016-07-17 NOTE — Anesthesia Procedure Notes (Signed)
Procedure Name: Intubation Date/Time: 07/17/2016 10:37 AM Performed by: Everlean Cherry A Pre-anesthesia Checklist: Patient identified, Emergency Drugs available, Suction available and Patient being monitored Patient Re-evaluated:Patient Re-evaluated prior to inductionOxygen Delivery Method: Circle system utilized Preoxygenation: Pre-oxygenation with 100% oxygen Intubation Type: IV induction Ventilation: Mask ventilation without difficulty Laryngoscope Size: Miller and 2 Grade View: Grade I Tube type: Oral Tube size: 7.0 mm Number of attempts: 1 Airway Equipment and Method: Stylet Placement Confirmation: ETT inserted through vocal cords under direct vision,  positive ETCO2 and breath sounds checked- equal and bilateral Secured at: 22 cm Tube secured with: Tape Dental Injury: Teeth and Oropharynx as per pre-operative assessment

## 2016-07-17 NOTE — Anesthesia Preprocedure Evaluation (Signed)
Anesthesia Evaluation  Patient identified by MRN, date of birth, ID band Patient awake    Reviewed: Allergy & Precautions, H&P , Patient's Chart, lab work & pertinent test results, reviewed documented beta blocker date and time   Airway Mallampati: II  TM Distance: >3 FB Neck ROM: full    Dental no notable dental hx.    Pulmonary asthma ,    Pulmonary exam normal breath sounds clear to auscultation       Cardiovascular  Rhythm:regular Rate:Normal     Neuro/Psych    GI/Hepatic GERD  ,  Endo/Other    Renal/GU      Musculoskeletal   Abdominal   Peds  Hematology   Anesthesia Other Findings Rare inhaler use  Reproductive/Obstetrics                             Anesthesia Physical Anesthesia Plan  ASA: II  Anesthesia Plan: General   Post-op Pain Management:    Induction: Intravenous  Airway Management Planned: Oral ETT  Additional Equipment:   Intra-op Plan:   Post-operative Plan: Extubation in OR  Informed Consent: I have reviewed the patients History and Physical, chart, labs and discussed the procedure including the risks, benefits and alternatives for the proposed anesthesia with the patient or authorized representative who has indicated his/her understanding and acceptance.   Dental Advisory Given and Dental advisory given  Plan Discussed with: CRNA and Surgeon  Anesthesia Plan Comments: (  Discussed general anesthesia, including possible nausea, instrumentation of airway, sore throat,pulmonary aspiration, etc. I asked if the were any outstanding questions, or  concerns before we proceeded. )        Anesthesia Quick Evaluation

## 2016-07-17 NOTE — Progress Notes (Signed)
Report given to jamie rn as caregiver 

## 2016-07-17 NOTE — Brief Op Note (Signed)
07/17/2016  12:27 PM  PATIENT:  Bonnie Lester  45 y.o. female  PRE-OPERATIVE DIAGNOSIS:  LEFT PAROTID MASS  POST-OPERATIVE DIAGNOSIS:  LEFT PAROTID MASS  PROCEDURE:  Procedure(s): LEFT PAROTIDECTOMY WITH FACIAL NERVE DISSECTION AND NIMS MONITORING (Left)  SURGEON:  Surgeon(s) and Role:    * Jerrell Belfast, MD - Primary    * Melida Quitter, MD - Assisting  PHYSICIAN ASSISTANT:   ASSISTANTS: Melida Quitter   ANESTHESIA:   general  EBL:  Total I/O In: 400 [I.V.:400] Out: 25 [Blood:25]  BLOOD ADMINISTERED:none  DRAINS: (7 FR) Jackson-Pratt drain(s) with closed bulb suction in the Left neck   LOCAL MEDICATIONS USED:  LIDOCAINE  and Amount: 4 ml  SPECIMEN:  Source of Specimen:  Left Parotid  DISPOSITION OF SPECIMEN:  PATHOLOGY  COUNTS:  YES  TOURNIQUET:  * No tourniquets in log *  DICTATION: .Other Dictation: Dictation Number [id number]  PLAN OF CARE: Admit for overnight observation  PATIENT DISPOSITION:  PACU - hemodynamically stable.   Delay start of Pharmacological VTE agent (>24hrs) due to surgical blood loss or risk of bleeding: no

## 2016-07-17 NOTE — Progress Notes (Signed)
07/17/2016 6:20 PM  Fredrich Romans [place]:[id number]  Post-Op Check   Temp:  [97.9 F (36.6 C)-98.6 F (37 C)] 98.6 F (37 C) (11/15 1411) Pulse Rate:  [69-91] 88 (11/15 1411) Resp:  [14-20] 17 (11/15 1411) BP: (95-116)/(52-77) 101/52 (11/15 1411) SpO2:  [95 %-100 %] 95 % (11/15 1411),     Intake/Output Summary (Last 24 hours) at 07/17/16 1820 Last data filed at 07/17/16 1600  Gross per 24 hour  Intake              990 ml  Output              825 ml  Net              165 ml    Results for orders placed or performed during the hospital encounter of 07/17/16 (from the past 24 hour(s))  CBC     Status: Abnormal   Collection Time: 07/17/16  2:45 PM  Result Value Ref Range   WBC 17.7 (H) 4.0 - 10.5 K/uL   RBC 4.45 3.87 - 5.11 MIL/uL   Hemoglobin 14.1 12.0 - 15.0 g/dL   HCT 41.2 36.0 - 46.0 %   MCV 92.6 78.0 - 100.0 fL   MCH 31.7 26.0 - 34.0 pg   MCHC 34.2 30.0 - 36.0 g/dL   RDW 12.9 11.5 - 15.5 %   Platelets 243 150 - 400 K/uL  Creatinine, serum     Status: None   Collection Time: 07/17/16  2:45 PM  Result Value Ref Range   Creatinine, Ser 0.76 0.44 - 1.00 mg/dL   GFR calc non Af Amer >60 >60 mL/min   GFR calc Af Amer >60 >60 mL/min    SUBJECTIVE:  Mod pain, some relief with Vicodin.  Increased pain with sour/sweet foods. Breathing well. Swallowing well.  No chest pain. Voiding. Ambulating.  OBJECTIVE:  Facial n intact. Wound flat. Drain functional  IMPRESSION:  Satisfactory post op check  PLAN:  Routine.  Drain out and discharge in AM anticipated.  Add Ibuprofen for pain alternating with Vicodin.  Bonnie Lester

## 2016-07-18 ENCOUNTER — Encounter (HOSPITAL_COMMUNITY): Payer: Self-pay | Admitting: Otolaryngology

## 2016-07-18 DIAGNOSIS — D110 Benign neoplasm of parotid gland: Secondary | ICD-10-CM | POA: Diagnosis not present

## 2016-07-18 NOTE — Progress Notes (Signed)
Bonnie Lester to be D/C'd  per MD order. Discussed with the patient and all questions fully answered.  VSS, Skin clean, dry and intact without evidence of skin break down, no evidence of skin tears noted.  IV catheter discontinued intact. Site without signs and symptoms of complications. Dressing and pressure applied.  An After Visit Summary was printed and given to the patient. Patient received prescription.  D/c education completed with patient/family including follow up instructions, medication list, d/c activities limitations if indicated, with other d/c instructions as indicated by MD - patient able to verbalize understanding, all questions fully answered.   Patient instructed to return to ED, call 911, or call MD for any changes in condition.   Patient to be escorted via [place], and D/C home via private auto.

## 2016-07-18 NOTE — Discharge Summary (Signed)
Physician Discharge Summary  Patient ID: NARA GOTWALT MRN: [id number] DOB/AGE: 02-09-1971 45 y.o.  Admit date: 07/17/2016 Discharge date: 07/18/2016  Admission Diagnoses:  Active Problems:   Parotid mass   Discharge Diagnoses:  Same  Surgeries: Procedure(s): LEFT PAROTIDECTOMY WITH FACIAL NERVE DISSECTION AND NIMS MONITORING on 07/17/2016   Consultants: none  Discharged Condition: Improved  Hospital Course: SCHELBY Lester is an 45 y.o. female who was admitted 07/17/2016 with a diagnosis of left parotid mass and went to the operating room on 07/17/2016 and underwent the above named procedures.   Pt stable POD #1, JP removed. D/C to home.  Recent vital signs:  Vitals:   07/18/16 0145 07/18/16 0614  BP: (!) 98/58 (!) 98/56  Pulse: 76 66  Resp: 16 16  Temp: 98.7 F (37.1 C) 99.1 F (37.3 C)    Recent laboratory studies:  Results for orders placed or performed during the hospital encounter of 07/17/16  CBC  Result Value Ref Range   WBC 17.7 (H) 4.0 - 10.5 K/uL   RBC 4.45 3.87 - 5.11 MIL/uL   Hemoglobin 14.1 12.0 - 15.0 g/dL   HCT 41.2 36.0 - 46.0 %   MCV 92.6 78.0 - 100.0 fL   MCH 31.7 26.0 - 34.0 pg   MCHC 34.2 30.0 - 36.0 g/dL   RDW 12.9 11.5 - 15.5 %   Platelets 243 150 - 400 K/uL  Creatinine, serum  Result Value Ref Range   Creatinine, Ser 0.76 0.44 - 1.00 mg/dL   GFR calc non Af Amer >60 >60 mL/min   GFR calc Af Amer >60 >60 mL/min    Discharge Medications:     Medication List    TAKE these medications   Beclomethasone Dipropionate 80 MCG/ACT Aers Commonly known as:  QNASL Use one puff in each nostril one to two times a day as directed. What changed:  how much to take  how to take this  when to take this  additional instructions   cetirizine 10 MG tablet Commonly known as:  ZYRTEC Take 10 mg by mouth every evening.   HYDROcodone-acetaminophen 5-325 MG tablet Commonly known as:  NORCO Take 1-2 tablets by mouth every 6 (six) hours as  needed.   IMITREX 100 MG tablet Generic drug:  SUMAtriptan Take 100 mg by mouth 2 (two) times daily as needed for migraine. May repeat in 2 hours if headache persists or recurs.   Magnesium 200 MG Tabs Take 200 mg by mouth 2 (two) times daily.   naproxen sodium 220 MG tablet Commonly known as:  ANAPROX Take 220-440 mg by mouth 2 (two) times daily as needed (for migraine headache.).   Olopatadine HCl 0.7 % Soln Commonly known as:  PAZEO Apply 1 drop to eye daily as needed. for itchy eyes.   ranitidine 300 MG tablet Commonly known as:  ZANTAC Take 1 tablet (300 mg total) by mouth at bedtime.   Riboflavin 100 MG Tabs Take 100 mg by mouth 2 (two) times daily.   VENTOLIN HFA 108 (90 Base) MCG/ACT inhaler Generic drug:  albuterol Inhale two puffs every four to six hours as needed for cough or wheeze.   VITAMIN D PO Take 1 tablet by mouth 2 (two) times daily.       Diagnostic Studies: No results found.  Disposition: 07-Left Against Medical Advice/Left Without Being Seen/Elopement  Discharge Instructions    Diet - low sodium heart healthy    Complete by:  As directed    Diet -  low sodium heart healthy    Complete by:  As directed    Discharge instructions    Complete by:  As directed    1. Limited activity 2. Liquid and soft diet, advance as tolerated 3. May bathe and shower day after surgery 4. Wound care - Gentle cleaning with soap and water 5. DO NOT APPLY ANY OINTMENT 6. Elevate Head of Bed   Increase activity slowly    Complete by:  As directed    Increase activity slowly    Complete by:  As directed       Follow-up Information    Nadir Vasques, MD. Schedule an appointment as soon as possible for a visit in 10 days.   Specialty:  Otolaryngology Contact information: [address] [place]  [address] [phone]            Signed: Jerrell Belfast 07/18/2016, 12:01 PM

## 2016-07-18 NOTE — Progress Notes (Signed)
   ENT Progress Note: POD #1 s/p Procedure(s): LEFT PAROTIDECTOMY WITH FACIAL NERVE DISSECTION AND NIMS MONITORING   Subjective: Post op migraine, no sig surgical pain.  Objective: Vital signs in last 24 hours: Temp:  [97.9 F (36.6 C)-99.1 F (37.3 C)] 99.1 F (37.3 C) (11/16 0614) Pulse Rate:  [66-91] 66 (11/16 0614) Resp:  [14-17] 16 (11/16 0614) BP: (92-116)/(52-77) 98/56 (11/16 0614) SpO2:  [94 %-100 %] 96 % (11/16 [id number]) Weight change:  Last BM Date: 07/17/16  Intake/Output from previous day: 11/15 0701 - 11/16 0700 In: [id number] [P.O.:720; I.V.:750] Out: 3132.5 [Urine:3100; Drains:7.5; Blood:25] Intake/Output this shift: Total I/O In: -  Out: 8 [Drains:8]  Labs:  Recent Labs  07/17/16 1445  WBC 17.7*  HGB 14.1  HCT 41.2  PLT 243   No results for input(s): NA, K, CL, CO2, GLUCOSE, BUN, CALCIUM in the last 72 hours.  Invalid input(s): CREATININR  Studies/Results: No results found.   PHYSICAL EXAM: Inc intact, JP removed FN function nl No swelling   Assessment/Plan: Stable POD #1 D/C to home.    [place], Emileigh Kellett 07/18/2016, 12:03 PM

## 2016-07-18 NOTE — Op Note (Signed)
Bonnie Lester, Bonnie Lester               ACCOUNT NO.:  [id number]  MEDICAL RECORD NO.:  [id number]  LOCATION:  [id number]                        FACILITY:  [facility]  PHYSICIAN:  Early Chars. Wilburn Cornelia, M.D.DATE OF BIRTH:  06/20/1971  DATE OF PROCEDURE:  07/17/2016 DATE OF DISCHARGE:                              OPERATIVE REPORT   LOCATION:  [facility] Main OR.  PROCEDURE:  Left superficial parotidectomy with facial nerve dissection.  PREOPERATIVE DIAGNOSIS:  Left parotid mass.  POSTOPERATIVE DIAGNOSIS:  Left parotid mass.  SURGEON:  Early Chars. Wilburn Cornelia, MD.  ASSISTANTOrpah Greek D. Redmond Baseman, MD.  ANESTHESIA:  General endotracheal.  COMPLICATIONS:  There were no complications.  BLOOD LOSS:  Less than 50 mL.  DISPOSITION:  The patient transferred from the operating room to the recovery room in stable condition.  BRIEF HISTORY:  The patient is an otherwise healthy 45 year old female, who was referred to our office for evaluation of a gradually enlarging mass involving the left periparotid region.  Examination in the office revealed a firm nodular mass measuring approximately 2 cm in size involving the posterior aspect of the left parotid gland.  A CT scan of the neck was obtained with contrast, which showed approximately 2 x 3 cm mass within the posterolateral aspect of the left parotid gland.  The patient had no other active symptoms.  Normal facial nerve function. Given her history and findings, I recommended left superficial parotidectomy with facial nerve dissection for management of the parotid mass.  The risks and benefits were discussed in detail with the patient and her family and they understood and agreed with our plan for surgery which was scheduled on elective basis at [facility]:  The patient was brought to the operating room on July 17, 2016 and placed in a supine position on the operating table.  General endotracheal anesthesia was  established without difficulty.  When the patient adequately anesthetized, she was positioned on the operating table and prepped and draped.  A surgical time-out was then performed with correct identification of the patient and the surgical procedure including the left laterality of the patient's parotid mass.  The patient was then injected with a total of 4 mL of 1% lidocaine with 1:100,000 dilution epinephrine which was injected in subcutaneous fashion along the proposed skin incision.  The Xomed NIMS device was then utilized.  Probes were placed at the left orbicular oculi and orbicularis oris muscle groups.  The facial nerve monitor was used throughout the facial nerve dissection component of the surgical procedure.  With the patient prepped, draped, and prepared for surgery, a curvilinear incision was created in the left periparotid region beginning in the preauricular sulcus, extending inferiorly around the earlobe, and then transitioning into a pre-existing skin crease in the left upper neck.  Using a #15 scalpel, the incision was created through the skin underlying subcutaneous tissue.  Bovie electrocautery was then used to transect the superficial periparotid fascia and elevate the skin and facial musculature anteriorly exposing the entire left parotid gland.  With the gland exposed, dissection was then carried out in the preauricular sulcus, anterior to the auricular tragus, dissecting from superficial to deep on  the inferior component of the incision.  The parotid gland was elevated and the anterior aspect of the sternocleidomastoid muscle was identified.  This was our posterior dissection plane and the entire parotid gland was elevated anteriorly. The main trunk of the facial nerve was then carefully dissected.  This was confirmed with stimulation.  With the main trunk identified, the parotid gland was lifted further anteriorly and dissection was carried out through the  gland elevating the superficial component of the gland including normal tissue and intraparotid tumor off the branches of the facial nerve.  The inferior and superior divisions were dissected.  Each individual branch was carefully dissected and was intact at the conclusion of the surgical procedure.  This was confirmed with Xomed NIMS stimulation of each branch.  The mass was then elevated free from the anterior periparotid fascia.  This included the 2 x 3 cm palpable mass plus the surrounding superficial parotid gland.  The entire specimen was sent to Pathology for gross and microscopic evaluation.  With the tumor removed, reconstruction was undertaken.  A Valsalva maneuver was performed by Anesthesia.  There was no bleeding.  The wound was carefully irrigated.  A 7-French round drain was placed at the inferior aspect of the dissection and carried out through the incision. This was sutured with a 3-0 Ethilon suture.  The skin and muscle flap were then reflected posteriorly and closed in multiple layers beginning with interrupted 4-0 Vicryl suture in the deep subcutaneous layer, superficial 5-0 Vicryl suture in horizontal mattress fashion in the superficial subcutaneous layer, and then final skin edge closure with Dermabond surgical glue.  The patient was then awakened from anesthetic. She was extubated and was transferred from the operating room to the recovery room in stable condition.  There were no complications and blood loss was less than 50 mL.          ______________________________ Early Chars. Wilburn Cornelia, M.D.     DLS/MEDQ  D:  [id number]  T:  07/18/2016  Job:  [id number]

## 2016-09-10 ENCOUNTER — Ambulatory Visit (INDEPENDENT_AMBULATORY_CARE_PROVIDER_SITE_OTHER): Payer: 59 | Admitting: Allergy and Immunology

## 2016-09-10 ENCOUNTER — Encounter (INDEPENDENT_AMBULATORY_CARE_PROVIDER_SITE_OTHER): Payer: Self-pay

## 2016-09-10 ENCOUNTER — Encounter: Payer: Self-pay | Admitting: Allergy and Immunology

## 2016-09-10 VITALS — BP 104/72 | HR 76 | Resp 20

## 2016-09-10 DIAGNOSIS — J309 Allergic rhinitis, unspecified: Secondary | ICD-10-CM | POA: Diagnosis not present

## 2016-09-10 DIAGNOSIS — K219 Gastro-esophageal reflux disease without esophagitis: Secondary | ICD-10-CM | POA: Diagnosis not present

## 2016-09-10 DIAGNOSIS — H1010 Acute atopic conjunctivitis, unspecified eye: Secondary | ICD-10-CM | POA: Diagnosis not present

## 2016-09-10 DIAGNOSIS — G43909 Migraine, unspecified, not intractable, without status migrainosus: Secondary | ICD-10-CM

## 2016-09-10 DIAGNOSIS — J4520 Mild intermittent asthma, uncomplicated: Secondary | ICD-10-CM

## 2016-09-10 MED ORDER — RANITIDINE HCL 300 MG PO TABS: 300.0000 mg | ORAL_TABLET | Freq: Every day | ORAL | 11 refills | Status: DC

## 2016-09-10 MED ORDER — BECLOMETHASONE DIPROPIONATE 80 MCG/ACT NA AERS: INHALATION_SPRAY | NASAL | 11 refills | Status: DC

## 2016-09-10 NOTE — Progress Notes (Signed)
Follow-up Note  Referring Provider: Fanny Bien, MD Primary Provider: Rachell Cipro, MD Date of Office Visit: 09/10/2016  Subjective:   Bonnie Lester (DOB: 02/08/1971) is a 46 y.o. female who returns to the [facility] [place] on 09/10/2016 in re-evaluation of the following:  HPI: [name] returns to this clinic in reevaluation of her allergic rhinoconjunctivitis and history of mild intermittent asthma and LPR and migraine syndrome. I last saw her in his clinic on 06/18/2016  She's had very good control of each of her issues. She's had very little issue with her upper airway while using Qnasl about every day to every other day. Her headache intensity and frequency has decreased dramatically. She's had very little issues with her throat or her reflux.She's had no need to use any Ventolin and has had no activity of her asthma even though she is physically active and has had exposure to cold this winter without any difficulty.  Bronda has been very good about tapering down her caffeine. She is now only drinking one caffeinated drink per day. She's noticed that ranitidine resulted in very good improvement regarding her reflux and her throat issue and she continues on this consistently.   Allergies as of 09/10/2016      Reactions   Penicillins Other (See Comments)   Headache & neck pain Has patient had a PCN reaction causing immediate rash, facial/tongue/throat swelling, SOB or lightheadedness with hypotension: No  Has patient had a PCN reaction causing severe rash involving mucus membranes or skin necrosis: No Has patient had a PCN reaction that required hospitalization No Has patient had a PCN reaction occurring within the last 10 years: No If all of the above answers are "NO", then may proceed with Cephalosporin use.      Medication List      Beclomethasone Dipropionate 80 MCG/ACT Aers Commonly known as:  QNASL Use one puff in each nostril one to two times a day as  directed.   cetirizine 10 MG tablet Commonly known as:  ZYRTEC Take 10 mg by mouth every evening.   IMITREX 100 MG tablet Generic drug:  SUMAtriptan Take 100 mg by mouth 2 (two) times daily as needed for migraine. May repeat in 2 hours if headache persists or recurs.   Magnesium 200 MG Tabs Take 200 mg by mouth 2 (two) times daily.   naproxen sodium 220 MG tablet Commonly known as:  ANAPROX Take 220-440 mg by mouth 2 (two) times daily as needed (for migraine headache.).   Olopatadine HCl 0.7 % Soln Commonly known as:  PAZEO Apply 1 drop to eye daily as needed. for itchy eyes.   ranitidine 300 MG tablet Commonly known as:  ZANTAC Take 1 tablet (300 mg total) by mouth at bedtime.   Riboflavin 100 MG Tabs Take 100 mg by mouth 2 (two) times daily.   VENTOLIN HFA 108 (90 Base) MCG/ACT inhaler Generic drug:  albuterol Inhale two puffs every four to six hours as needed for cough or wheeze.   VITAMIN D PO Take 1 tablet by mouth 2 (two) times daily.       Past Medical History:  Diagnosis Date  . Deviated septum   . GERD (gastroesophageal reflux disease)   . Migraines    "guaranteed once/month; sporatic from there" (07/17/2016)  . Parotid tumor   . PONV (postoperative nausea and vomiting)   . Seasonal asthma     Past Surgical History:  Procedure Laterality Date  . ENDOMETRIAL ABLATION  2009  . HAND SURGERY Left 2004   nerve repair  . INGUINAL HERNIA REPAIR Right 1999  . PAROTIDECTOMY Left 07/16/2016   WITH FACIAL NERVE DISSECTION AND NIMS MONITORING   . PAROTIDECTOMY Left 07/17/2016   Procedure: LEFT PAROTIDECTOMY WITH FACIAL NERVE DISSECTION AND NIMS MONITORING;  Surgeon: Jerrell Belfast, MD;  Location: [facility];  Service: ENT;  Laterality: Left;  . TUBAL LIGATION  2001    Review of systems negative except as noted in HPI / PMHx or noted below:  Review of Systems  Constitutional: Negative.   HENT: Negative.   Eyes: Negative.   Respiratory: Negative.     Cardiovascular: Negative.   Gastrointestinal: Negative.   Genitourinary: Negative.   Musculoskeletal: Negative.   Skin: Negative.   Neurological: Negative.   Endo/Heme/Allergies: Negative.   Psychiatric/Behavioral: Negative.      Objective:   Vitals:   09/10/16 1051  BP: 104/72  Pulse: 76  Resp: 20          Physical Exam  Constitutional: She is well-developed, well-nourished, and in no distress.  HENT:  Head: Normocephalic.  Right Ear: Tympanic membrane, external ear and ear canal normal.  Left Ear: Tympanic membrane, external ear and ear canal normal.  Nose: Nose normal. No mucosal edema or rhinorrhea.  Mouth/Throat: Uvula is midline, oropharynx is clear and moist and mucous membranes are normal. No oropharyngeal exudate.  Eyes: Conjunctivae are normal.  Neck: Trachea normal. No tracheal tenderness present. No tracheal deviation present. No thyromegaly present.  Cardiovascular: Normal rate, regular rhythm, S1 normal, S2 normal and normal heart sounds.   No murmur heard. Pulmonary/Chest: Breath sounds normal. No stridor. No respiratory distress. She has no wheezes. She has no rales.  Musculoskeletal: She exhibits no edema.  Lymphadenopathy:       Head (right side): No tonsillar adenopathy present.       Head (left side): No tonsillar adenopathy present.    She has no cervical adenopathy.  Neurological: She is alert. Gait normal.  Skin: No rash noted. She is not diaphoretic. No erythema. Nails show no clubbing.  Psychiatric: Mood and affect normal.    Diagnostics: none  Assessment and Plan:   1. Asthma, mild intermittent, well-controlled   2. Allergic rhinoconjunctivitis   3. LPRD (laryngopharyngeal reflux disease)   4. Migraine syndrome     1. Continue to perform Allergen avoidance measures  2. Continue to Treat and prevent inflammation:   A. Qnasl 80 - 1 puff each nostril 3-7 times per week  3. Continue to Treat and prevent LPR:   A. replace throat  clearing with swallowing maneuver  B. slowly taper off all caffeine  C. ranitidine 300 one tablet in PM  4. Continue to Treat and prevent headache:   A. slowly taper off caffeine  B. taper off all analgesics  3. If needed:   A. nasal saline spray  B. OTC antihistamine - Claritin/Zyrtec/Allegra  C. Pazeo one drop each eye once a day  D. Ventolin Hfa 2 puffs every 4-6 hours  4. Return to clinic if problems  Rasheen is doing quite well on her current plan and I see no need for her to return to this clinic on a regular basis. She has a very good understanding of her medical conditions and how her medications work. She appears to understand the appropriate use of these medications ann the appropriate dosage to use depending on the extent of her disease state activity. I will be very happy to see her  back in this clinic should there be a problem in the future but she can have refills from her primary care physician from this point on forward if she does well.   Allena Katz, MD [place]

## 2016-09-10 NOTE — Patient Instructions (Addendum)
  1. Continue to perform Allergen avoidance measures  2. Continue to Treat and prevent inflammation:   A. Qnasl 80 - 1 puff each nostril 3-7 times per week  3. Continue to Treat and prevent LPR:   A. replace throat clearing with swallowing maneuver  B. slowly taper off all caffeine  C. ranitidine 300 one tablet in PM  4. Continue to Treat and prevent headache:   A. slowly taper off caffeine  B. taper off all analgesics  3. If needed:   A. nasal saline spray  B. OTC antihistamine - Claritin/Zyrtec/Allegra  C. Pazeo one drop each eye once a day  D. Ventolin Hfa 2 puffs every 4-6 hours  4. Return to clinic if problems

## 2016-10-04 DIAGNOSIS — D3131 Benign neoplasm of right choroid: Secondary | ICD-10-CM | POA: Diagnosis not present

## 2016-10-27 DIAGNOSIS — J069 Acute upper respiratory infection, unspecified: Secondary | ICD-10-CM | POA: Diagnosis not present

## 2016-10-27 DIAGNOSIS — G43909 Migraine, unspecified, not intractable, without status migrainosus: Secondary | ICD-10-CM | POA: Diagnosis not present

## 2016-10-27 DIAGNOSIS — R110 Nausea: Secondary | ICD-10-CM | POA: Diagnosis not present

## 2016-10-30 ENCOUNTER — Other Ambulatory Visit: Payer: Self-pay | Admitting: *Deleted

## 2016-10-30 MED ORDER — BECLOMETHASONE DIPROPIONATE 80 MCG/ACT NA AERS: INHALATION_SPRAY | NASAL | 3 refills | Status: DC

## 2016-10-30 MED ORDER — RANITIDINE HCL 300 MG PO TABS: 300.0000 mg | ORAL_TABLET | Freq: Every day | ORAL | 3 refills | Status: DC

## 2017-03-06 DIAGNOSIS — J343 Hypertrophy of nasal turbinates: Secondary | ICD-10-CM | POA: Diagnosis not present

## 2017-03-06 DIAGNOSIS — J342 Deviated nasal septum: Secondary | ICD-10-CM | POA: Diagnosis not present

## 2017-03-06 DIAGNOSIS — M950 Acquired deformity of nose: Secondary | ICD-10-CM | POA: Diagnosis not present

## 2017-04-30 DIAGNOSIS — J342 Deviated nasal septum: Secondary | ICD-10-CM | POA: Diagnosis not present

## 2017-04-30 DIAGNOSIS — M950 Acquired deformity of nose: Secondary | ICD-10-CM | POA: Diagnosis not present

## 2017-04-30 DIAGNOSIS — J343 Hypertrophy of nasal turbinates: Secondary | ICD-10-CM | POA: Diagnosis not present

## 2017-04-30 DIAGNOSIS — J3489 Other specified disorders of nose and nasal sinuses: Secondary | ICD-10-CM | POA: Diagnosis not present

## 2017-09-04 DIAGNOSIS — R110 Nausea: Secondary | ICD-10-CM | POA: Diagnosis not present

## 2017-09-13 ENCOUNTER — Other Ambulatory Visit: Payer: Self-pay | Admitting: Allergy and Immunology

## 2017-09-18 DIAGNOSIS — H16143 Punctate keratitis, bilateral: Secondary | ICD-10-CM | POA: Diagnosis not present

## 2017-10-03 DIAGNOSIS — Z01419 Encounter for gynecological examination (general) (routine) without abnormal findings: Secondary | ICD-10-CM | POA: Diagnosis not present

## 2017-10-03 DIAGNOSIS — Z6827 Body mass index (BMI) 27.0-27.9, adult: Secondary | ICD-10-CM | POA: Diagnosis not present

## 2017-10-03 DIAGNOSIS — N926 Irregular menstruation, unspecified: Secondary | ICD-10-CM | POA: Diagnosis not present

## 2017-10-10 DIAGNOSIS — D3131 Benign neoplasm of right choroid: Secondary | ICD-10-CM | POA: Diagnosis not present

## 2017-10-17 DIAGNOSIS — Z131 Encounter for screening for diabetes mellitus: Secondary | ICD-10-CM | POA: Diagnosis not present

## 2017-10-17 DIAGNOSIS — Z13220 Encounter for screening for lipoid disorders: Secondary | ICD-10-CM | POA: Diagnosis not present

## 2017-10-17 DIAGNOSIS — Z1329 Encounter for screening for other suspected endocrine disorder: Secondary | ICD-10-CM | POA: Diagnosis not present

## 2017-11-21 DIAGNOSIS — G43709 Chronic migraine without aura, not intractable, without status migrainosus: Secondary | ICD-10-CM | POA: Diagnosis not present

## 2017-12-28 ENCOUNTER — Ambulatory Visit: Payer: Self-pay | Admitting: Nurse Practitioner

## 2017-12-28 ENCOUNTER — Encounter: Payer: Self-pay | Admitting: Nurse Practitioner

## 2017-12-28 VITALS — BP 116/78 | HR 92 | Temp 99.3°F | Resp 17 | Wt 161.1 lb

## 2017-12-28 DIAGNOSIS — K529 Noninfective gastroenteritis and colitis, unspecified: Secondary | ICD-10-CM

## 2017-12-28 MED ORDER — CIPROFLOXACIN HCL 500 MG PO TABS: 500.0000 mg | ORAL_TABLET | Freq: Two times a day (BID) | ORAL | 0 refills | Status: DC

## 2017-12-28 MED ORDER — ONDANSETRON HCL 4 MG PO TABS: 4.0000 mg | ORAL_TABLET | Freq: Three times a day (TID) | ORAL | 0 refills | Status: DC | PRN

## 2017-12-28 NOTE — Patient Instructions (Signed)

## 2017-12-28 NOTE — Progress Notes (Addendum)
   Subjective:    Patient ID: Bonnie Lester, female    DOB: 1970/12/31, 47 y.o.   MRN: [id number]  HPI  Patient come sin today c/o diarrhea and vomiting that started yesterday. Had fever of 102 yesterday with body ahces. Just git back from the [place] yesterday.   Review of Systems  Constitutional: Positive for chills and fever.  HENT: Negative.   Respiratory: Negative.   Cardiovascular: Negative.   Gastrointestinal: Positive for abdominal pain, diarrhea, nausea and vomiting. Negative for constipation.  Genitourinary: Negative.   Neurological: Negative.   Psychiatric/Behavioral: Negative.        Objective:   Physical Exam  Constitutional: She is oriented to person, place, and time. She appears well-developed and well-nourished. She appears distressed (mild).  HENT:  Head: Normocephalic.  Right Ear: External ear normal.  Left Ear: External ear normal.  Nose: Nose normal.  Mouth/Throat: Oropharynx is clear and moist.  Eyes: Pupils are equal, round, and reactive to light.  Neck: Normal range of motion.  Cardiovascular: Normal rate and regular rhythm.  Pulmonary/Chest: Effort normal and breath sounds normal.  Abdominal: Soft.  Neurological: She is alert and oriented to person, place, and time.  Skin: Skin is warm.   BP 116/78 (BP Location: Right Arm, Patient Position: Sitting, Cuff Size: Normal)   Pulse 92   Temp 99.3 F (37.4 C) (Oral)   Resp 17   Wt 161 lb 1.6 oz (73.1 kg)   SpO2 96%   BMI 27.65 kg/m         Assessment & Plan:  1. Gastroenteritis First 24 Hours-Clear liquids  popsicles  Jello  gatorade  Sprite Second 24 hours-Add Full liquids ( Liquids you cant see through) Third 24 hours- Bland diet ( foods that are baked or broiled)  *avoiding fried foods and highly spiced foods* During these 3 days  Avoid milk, cheese, ice cream or any other dairy products  Avoid caffeine- REMEMBER Mt. Dew and Mello Yellow contain lots of caffeine You should  eat and drink in  Frequent small volumes If no improvement in symptoms or worsen in 2-3 days should RETRUN TO OFFICE or go to ER!   * going to give cipro just  In case this is bacterial from travels to [place]  - ondansetron (ZOFRAN) 4 MG tablet; Take 1 tablet (4 mg total) by mouth every 8 (eight) hours as needed for nausea or vomiting.  Dispense: 20 tablet; Refill: 0 - ciprofloxacin (CIPRO) 500 MG tablet; Take 1 tablet (500 mg total) by mouth 2 (two) times daily.  Dispense: 20 tablet; Refill: 0  Mary-Margaret Hassell Done, FNP

## 2018-02-23 DIAGNOSIS — M9903 Segmental and somatic dysfunction of lumbar region: Secondary | ICD-10-CM | POA: Diagnosis not present

## 2018-02-23 DIAGNOSIS — M9905 Segmental and somatic dysfunction of pelvic region: Secondary | ICD-10-CM | POA: Diagnosis not present

## 2018-02-23 DIAGNOSIS — M9902 Segmental and somatic dysfunction of thoracic region: Secondary | ICD-10-CM | POA: Diagnosis not present

## 2018-04-28 ENCOUNTER — Other Ambulatory Visit: Payer: Self-pay | Admitting: Allergy and Immunology

## 2018-04-29 ENCOUNTER — Telehealth: Payer: Self-pay

## 2018-04-29 NOTE — Telephone Encounter (Signed)
Refill sent.

## 2018-06-15 ENCOUNTER — Ambulatory Visit: Payer: Self-pay | Admitting: Nurse Practitioner

## 2018-06-15 VITALS — BP 110/80 | Temp 98.5°F | Wt 165.6 lb

## 2018-06-15 DIAGNOSIS — H6982 Other specified disorders of Eustachian tube, left ear: Secondary | ICD-10-CM

## 2018-06-15 DIAGNOSIS — J209 Acute bronchitis, unspecified: Secondary | ICD-10-CM

## 2018-06-15 DIAGNOSIS — J0140 Acute pansinusitis, unspecified: Secondary | ICD-10-CM

## 2018-06-15 MED ORDER — DOXYCYCLINE HYCLATE 100 MG PO TABS: 100.0000 mg | ORAL_TABLET | Freq: Two times a day (BID) | ORAL | 0 refills | Status: DC

## 2018-06-15 MED ORDER — PREDNISONE 10 MG (21) PO TBPK: ORAL_TABLET | ORAL | 0 refills | Status: DC

## 2018-06-15 MED ORDER — PSEUDOEPH-BROMPHEN-DM 30-2-10 MG/5ML PO SYRP: 5.0000 mL | ORAL_SOLUTION | Freq: Four times a day (QID) | ORAL | 0 refills | Status: AC | PRN

## 2018-06-15 MED ORDER — MONTELUKAST SODIUM 10 MG PO TABS: 10.0000 mg | ORAL_TABLET | Freq: Every day | ORAL | 0 refills | Status: DC

## 2018-06-15 NOTE — Progress Notes (Addendum)
Subjective:  Bonnie Lester is a 47 y.o. female who presents for evaluation of and URI .  Symptoms include left ear pressure/pain, chest pain during cough, nasal congestion, post nasal drip, productive cough with green colored sputum, sinus pressure, sinus pain and sore throat.  Onset of symptoms was 2 weeks ago, and has been unchanged since that time.  Treatment to date:  Nyquil, Advil and zyrtec.  High risk factors for influenza complications:  none.  Patient also informed she has a history of a hernia.  Patient states that she has coughed excessively and is concerned about her hernia.  The following portions of the patient's history were reviewed and updated as appropriate:  allergies, current medications and past medical history.  Constitutional: positive for anorexia and fatigue, negative for chills, fevers, malaise, night sweats and sweats Eyes: negative Ears, nose, mouth, throat, and face: positive for nasal congestion, sore throat and left ear pressure/fullness, negative for ear drainage, earaches and hoarseness Respiratory: positive for cough, sputum and wheezing, negative for asthma, chronic bronchitis, pneumonia, sputum and stridor Cardiovascular: negative Gastrointestinal: negative Neurological: positive for headaches, negative for coordination problems, dizziness, paresthesia, speech problems, tremors, vertigo and weakness Allergic/Immunologic: positive for hay fever   Objective:  BP 110/80 (BP Location: Right Arm, Patient Position: Sitting)   Temp 98.5 F (36.9 C) (Oral)   Wt 165 lb 9.6 oz (75.1 kg)   BMI 28.43 kg/m  General appearance: alert, cooperative, fatigued and no distress Head: Normocephalic, without obvious abnormality, atraumatic Eyes: conjunctivae/corneas clear. PERRL, EOM's intact. Fundi benign. Ears: abnormal TM left ear - mucoid middle ear fluid Nose: no discharge, moderate congestion, turbinates swollen, inflamed, moderate maxillary sinus tenderness bilateral,  mild frontal sinus tenderness bilateral Throat: lips, mucosa, and tongue normal; teeth and gums normal Lungs: clear to auscultation bilaterally Heart: regular rate and rhythm, S1, S2 normal, no murmur, click, rub or gallop Abdomen: soft, non-tender; bowel sounds normal; no masses,  no organomegaly Pulses: 2+ and symmetric Skin: Skin color, texture, turgor normal. No rashes or lesions Lymph nodes: cervical and submandibular nodes normal Neurologic: Grossly normal    Assessment:  Acute Pansinusitis Acute Bronchitis Left Eustachian Tube Dysfunction    Plan:   Exam findings, diagnosis etiology and medication use and indications reviewed with patient. Follow- Up and discharge instructions provided. No emergent/urgent issues found on exam. Patient education was provided. Patient verbalized understanding of information provided and agrees with plan of care (POC), all questions answered.  The patient was informed to follow-up with her PCP regarding her concerns around her hernia.  Patient states she does not have a PCP at this time, that she only sees specialist.  Provided the patient the number for Primary Care at [facility] for follow-up..  The patient is advised to call or return to clinic if condition does not see an improvement in symptoms, or to seek the care of the closest emergency department if condition worsens with the above plan.   1. Acute pansinusitis, recurrence not specified  - doxycycline (VIBRA-TABS) 100 MG tablet; Take 1 tablet (100 mg total) by mouth 2 (two) times daily.  Dispense: 20 tablet; Refill: 0 - montelukast (SINGULAIR) 10 MG tablet; Take 1 tablet (10 mg total) by mouth at bedtime.  Dispense: 30 tablet; Refill: 0 -Take medications as prescribed. -Ibuprofen or Tylenol for fever, pain, or general discomfort. -Increase fluids.  Get plenty of rest. -May use a teaspoon of honey for cough. -Use humidifier or vaporizer when at home and during sleep. -Sleep  elevated on  at least 2 pillows at bedtime. -Follow-up with PCP if symptoms do not improve.  2. Acute bronchitis, unspecified organism  - predniSONE (STERAPRED UNI-PAK 21 TAB) 10 MG (21) TBPK tablet; Take as directed.  Dispense: 21 tablet; Refill: 0 - brompheniramine-pseudoephedrine-DM 30-2-10 MG/5ML syrup; Take 5 mLs by mouth 4 (four) times daily as needed for up to 7 days.  Dispense: 150 mL; Refill: 0 -Take medications as prescribed. -Ibuprofen or Tylenol for fever, pain, or general discomfort. -Increase fluids.  Get plenty of rest. -May use a teaspoon of honey for cough. -Use humidifier or vaporizer when at home and during sleep. -Sleep elevated on at least 2 pillows at bedtime. -Follow-up with PCP if symptoms do not improve.  3. Acute dysfunction of left eustachian tube  - predniSONE (STERAPRED UNI-PAK 21 TAB) 10 MG (21) TBPK tablet; Take as directed.  Dispense: 21 tablet; Refill: 0

## 2018-06-15 NOTE — Patient Instructions (Addendum)
Acute Bronchitis, Adult  -Take medications as prescribed. -Ibuprofen or Tylenol for fever, pain, or general discomfort. -Increase fluids.  Get plenty of rest. -May use a teaspoon of honey for cough. -Use humidifier or vaporizer when at home and during sleep. -Sleep elevated on at least 2 pillows at bedtime. -Follow-up with PCP if symptoms do not improve.      Acute bronchitis is sudden (acute) swelling of the air tubes (bronchi) in the lungs. Acute bronchitis causes these tubes to fill with mucus, which can make it hard to breathe. It can also cause coughing or wheezing. In adults, acute bronchitis usually goes away within 2 weeks. A cough caused by bronchitis may last up to 3 weeks. Smoking, allergies, and asthma can make the condition worse. Repeated episodes of bronchitis may cause further lung problems, such as chronic obstructive pulmonary disease (COPD). What are the causes? This condition can be caused by germs and by substances that irritate the lungs, including:  Cold and flu viruses. This condition is most often caused by the same virus that causes a cold.  Bacteria.  Exposure to tobacco smoke, dust, fumes, and air pollution.  What increases the risk? This condition is more likely to develop in people who:  Have close contact with someone with acute bronchitis.  Are exposed to lung irritants, such as tobacco smoke, dust, fumes, and vapors.  Have a weak immune system.  Have a respiratory condition such as asthma.  What are the signs or symptoms? Symptoms of this condition include:  A cough.  Coughing up clear, yellow, or green mucus.  Wheezing.  Chest congestion.  Shortness of breath.  A fever.  Body aches.  Chills.  A sore throat.  How is this diagnosed? This condition is usually diagnosed with a physical exam. During the exam, your health care provider may order tests, such as chest X-rays, to rule out other conditions. He or she may also:  Test  a sample of your mucus for bacterial infection.  Check the level of oxygen in your blood. This is done to check for pneumonia.  Do a chest X-ray or lung function testing to rule out pneumonia and other conditions.  Perform blood tests.  Your health care provider will also ask about your symptoms and medical history. How is this treated? Most cases of acute bronchitis clear up over time without treatment. Your health care provider may recommend:  Drinking more fluids. Drinking more makes your mucus thinner, which may make it easier to breathe.  Taking a medicine for a fever or cough.  Taking an antibiotic medicine.  Using an inhaler to help improve shortness of breath and to control a cough.  Using a cool mist vaporizer or humidifier to make it easier to breathe.  Follow these instructions at home: Medicines  Take over-the-counter and prescription medicines only as told by your health care provider.  If you were prescribed an antibiotic, take it as told by your health care provider. Do not stop taking the antibiotic even if you start to feel better. General instructions  Get plenty of rest.  Drink enough fluids to keep your urine clear or pale yellow.  Avoid smoking and secondhand smoke. Exposure to cigarette smoke or irritating chemicals will make bronchitis worse. If you smoke and you need help quitting, ask your health care provider. Quitting smoking will help your lungs heal faster.  Use an inhaler, cool mist vaporizer, or humidifier as told by your health care provider.  Keep all follow-up  visits as told by your health care provider. This is important. How is this prevented? To lower your risk of getting this condition again:  Wash your hands often with soap and water. If soap and water are not available, use hand sanitizer.  Avoid contact with people who have cold symptoms.  Try not to touch your hands to your mouth, nose, or eyes.  Make sure to get the flu shot  every year.  Contact a health care provider if:  Your symptoms do not improve in 2 weeks of treatment. Get help right away if:  You cough up blood.  You have chest pain.  You have severe shortness of breath.  You become dehydrated.  You faint or keep feeling like you are going to faint.  You keep vomiting.  You have a severe headache.  Your fever or chills gets worse. This information is not intended to replace advice given to you by your health care provider. Make sure you discuss any questions you have with your health care provider. Document Released: 09/26/2004 Document Revised: 03/13/2016 Document Reviewed: 02/07/2016 Elsevier Interactive Patient Education  2018 [employer].  Sinusitis, Adult Sinusitis is soreness and inflammation of your sinuses. Sinuses are hollow spaces in the bones around your face. Your sinuses are located:  Around your eyes.  In the middle of your forehead.  Behind your nose.  In your cheekbones.  Your sinuses and nasal passages are lined with a stringy fluid (mucus). Mucus normally drains out of your sinuses. When your nasal tissues become inflamed or swollen, the mucus can become trapped or blocked so air cannot flow through your sinuses. This allows bacteria, viruses, and funguses to grow, which leads to infection. Sinusitis can develop quickly and last for 7?10 days (acute) or for more than 12 weeks (chronic). Sinusitis often develops after a cold. What are the causes? This condition is caused by anything that creates swelling in the sinuses or stops mucus from draining, including:  Allergies.  Asthma.  Bacterial or viral infection.  Abnormally shaped bones between the nasal passages.  Nasal growths that contain mucus (nasal polyps).  Narrow sinus openings.  Pollutants, such as chemicals or irritants in the air.  A foreign object stuck in the nose.  A fungal infection. This is rare.  What increases the risk? The following  factors may make you more likely to develop this condition:  Having allergies or asthma.  Having had a recent cold or respiratory tract infection.  Having structural deformities or blockages in your nose or sinuses.  Having a weak immune system.  Doing a lot of swimming or diving.  Overusing nasal sprays.  Smoking.  What are the signs or symptoms? The main symptoms of this condition are pain and a feeling of pressure around the affected sinuses. Other symptoms include:  Upper toothache.  Earache.  Headache.  Bad breath.  Decreased sense of smell and taste.  A cough that may get worse at night.  Fatigue.  Fever.  Thick drainage from your nose. The drainage is often green and it may contain pus (purulent).  Stuffy nose or congestion.  Postnasal drip. This is when extra mucus collects in the throat or back of the nose.  Swelling and warmth over the affected sinuses.  Sore throat.  Sensitivity to light.  How is this diagnosed? This condition is diagnosed based on symptoms, a medical history, and a physical exam. To find out if your condition is acute or chronic, your health care provider  may:  Look in your nose for signs of nasal polyps.  Tap over the affected sinus to check for signs of infection.  View the inside of your sinuses using an imaging device that has a light attached (endoscope).  If your health care provider suspects that you have chronic sinusitis, you may also:  Be tested for allergies.  Have a sample of mucus taken from your nose (nasal culture) and checked for bacteria.  Have a mucus sample examined to see if your sinusitis is related to an allergy.  If your sinusitis does not respond to treatment and it lasts longer than 8 weeks, you may have an MRI or CT scan to check your sinuses. These scans also help to determine how severe your infection is. In rare cases, a bone biopsy may be done to rule out more serious types of fungal sinus  disease. How is this treated? Treatment for sinusitis depends on the cause and whether your condition is chronic or acute. If a virus is causing your sinusitis, your symptoms will go away on their own within 10 days. You may be given medicines to relieve your symptoms, including:  Topical nasal decongestants. They shrink swollen nasal passages and let mucus drain from your sinuses.  Antihistamines. These drugs block inflammation that is triggered by allergies. This can help to ease swelling in your nose and sinuses.  Topical nasal corticosteroids. These are nasal sprays that ease inflammation and swelling in your nose and sinuses.  Nasal saline washes. These rinses can help to get rid of thick mucus in your nose.  If your condition is caused by bacteria, you will be given an antibiotic medicine. If your condition is caused by a fungus, you will be given an antifungal medicine. Surgery may be needed to correct underlying conditions, such as narrow nasal passages. Surgery may also be needed to remove polyps. Follow these instructions at home: Medicines  Take, use, or apply over-the-counter and prescription medicines only as told by your health care provider. These may include nasal sprays.  If you were prescribed an antibiotic medicine, take it as told by your health care provider. Do not stop taking the antibiotic even if you start to feel better. Hydrate and Humidify  Drink enough water to keep your urine clear or pale yellow. Staying hydrated will help to thin your mucus.  Use a cool mist humidifier to keep the humidity level in your home above 50%.  Inhale steam for 10-15 minutes, 3-4 times a day or as told by your health care provider. You can do this in the bathroom while a hot shower is running.  Limit your exposure to cool or dry air. Rest  Rest as much as possible.  Sleep with your head raised (elevated).  Make sure to get enough sleep each night. General  instructions  Apply a warm, moist washcloth to your face 3-4 times a day or as told by your health care provider. This will help with discomfort.  Wash your hands often with soap and water to reduce your exposure to viruses and other germs. If soap and water are not available, use hand sanitizer.  Do not smoke. Avoid being around people who are smoking (secondhand smoke).  Keep all follow-up visits as told by your health care provider. This is important. Contact a health care provider if:  You have a fever.  Your symptoms get worse.  Your symptoms do not improve within 10 days. Get help right away if:  You have  a severe headache.  You have persistent vomiting.  You have pain or swelling around your face or eyes.  You have vision problems.  You develop confusion.  Your neck is stiff.  You have trouble breathing. This information is not intended to replace advice given to you by your health care provider. Make sure you discuss any questions you have with your health care provider. Document Released: 08/19/2005 Document Revised: 04/14/2016 Document Reviewed: 06/14/2015 Elsevier Interactive Patient Education  2018 [employer].  Eustachian Tube Dysfunction The eustachian tube connects the middle ear to the back of the nose. It regulates air pressure in the middle ear by allowing air to move between the ear and nose. It also helps to drain fluid from the middle ear space. When the eustachian tube does not function properly, air pressure, fluid, or both can build up in the middle ear. Eustachian tube dysfunction can affect one or both ears. What are the causes? This condition happens when the eustachian tube becomes blocked or cannot open normally. This may result from:  Ear infections.  Colds and other upper respiratory infections.  Allergies.  Irritation, such as from cigarette smoke or acid from the stomach coming up into the esophagus (gastroesophageal reflux).  Sudden  changes in air pressure, such as from descending in an airplane.  Abnormal growths in the nose or throat, such as nasal polyps, tumors, or enlarged tissue at the back of the throat (adenoids).  What increases the risk? This condition may be more likely to develop in people who smoke and people who are overweight. Eustachian tube dysfunction may also be more likely to develop in children, especially children who have:  Certain birth defects of the mouth, such as cleft palate.  Large tonsils and adenoids.  What are the signs or symptoms? Symptoms of this condition may include:  A feeling of fullness in the ear.  Ear pain.  Clicking or popping noises in the ear.  Ringing in the ear.  Hearing loss.  Loss of balance.  Symptoms may get worse when the air pressure around you changes, such as when you travel to an area of high elevation or fly on an airplane. How is this diagnosed? This condition may be diagnosed based on:  Your symptoms.  A physical exam of your ear, nose, and throat.  Tests, such as those that measure: ? The movement of your eardrum (tympanogram). ? Your hearing (audiometry).  How is this treated? Treatment depends on the cause and severity of your condition. If your symptoms are mild, you may be able to relieve your symptoms by moving air into ("popping") your ears. If you have symptoms of fluid in your ears, treatment may include:  Decongestants.  Antihistamines.  Nasal sprays or ear drops that contain medicines that reduce swelling (steroids).  In some cases, you may need to have a procedure to drain the fluid in your eardrum (myringotomy). In this procedure, a small tube is placed in the eardrum to:  Drain the fluid.  Restore the air in the middle ear space.  Follow these instructions at home:  Take over-the-counter and prescription medicines only as told by your health care provider.  Use techniques to help pop your ears as recommended by your  health care provider. These may include: ? Chewing gum. ? Yawning. ? Frequent, forceful swallowing. ? Closing your mouth, holding your nose closed, and gently blowing as if you are trying to blow air out of your nose.  Do not do any  of the following until your health care provider approves: ? Travel to high altitudes. ? Fly in airplanes. ? Work in a Pension scheme manager or room. ? Scuba dive.  Keep your ears dry. Dry your ears completely after showering or bathing.  Do not smoke.  Keep all follow-up visits as told by your health care provider. This is important. Contact a health care provider if:  Your symptoms do not go away after treatment.  Your symptoms come back after treatment.  You are unable to pop your ears.  You have: ? A fever. ? Pain in your ear. ? Pain in your head or neck. ? Fluid draining from your ear.  Your hearing suddenly changes.  You become very dizzy.  You lose your balance. This information is not intended to replace advice given to you by your health care provider. Make sure you discuss any questions you have with your health care provider. Document Released: 09/15/2015 Document Revised: 01/25/2016 Document Reviewed: 09/07/2014 Elsevier Interactive Patient Education  [employer].

## 2018-06-17 ENCOUNTER — Telehealth: Payer: Self-pay

## 2018-06-17 NOTE — Telephone Encounter (Signed)
  Patient was not available.

## 2018-07-02 DIAGNOSIS — M9905 Segmental and somatic dysfunction of pelvic region: Secondary | ICD-10-CM | POA: Diagnosis not present

## 2018-07-02 DIAGNOSIS — M9903 Segmental and somatic dysfunction of lumbar region: Secondary | ICD-10-CM | POA: Diagnosis not present

## 2018-07-02 DIAGNOSIS — M9902 Segmental and somatic dysfunction of thoracic region: Secondary | ICD-10-CM | POA: Diagnosis not present

## 2018-07-09 DIAGNOSIS — M9902 Segmental and somatic dysfunction of thoracic region: Secondary | ICD-10-CM | POA: Diagnosis not present

## 2018-07-09 DIAGNOSIS — M9905 Segmental and somatic dysfunction of pelvic region: Secondary | ICD-10-CM | POA: Diagnosis not present

## 2018-07-09 DIAGNOSIS — M9903 Segmental and somatic dysfunction of lumbar region: Secondary | ICD-10-CM | POA: Diagnosis not present

## 2018-08-24 DIAGNOSIS — H01009 Unspecified blepharitis unspecified eye, unspecified eyelid: Secondary | ICD-10-CM | POA: Diagnosis not present

## 2018-09-03 ENCOUNTER — Encounter (HOSPITAL_COMMUNITY): Payer: Self-pay | Admitting: Emergency Medicine

## 2018-09-03 ENCOUNTER — Other Ambulatory Visit: Payer: Self-pay

## 2018-09-03 ENCOUNTER — Ambulatory Visit (HOSPITAL_COMMUNITY)
Admission: EM | Admit: 2018-09-03 | Discharge: 2018-09-03 | Disposition: A | Discharge status: Home / Self Care | Payer: 59 | Attending: Family Medicine | Admitting: Family Medicine

## 2018-09-03 DIAGNOSIS — H01131 Eczematous dermatitis of right upper eyelid: Secondary | ICD-10-CM

## 2018-09-03 DIAGNOSIS — H01134 Eczematous dermatitis of left upper eyelid: Secondary | ICD-10-CM | POA: Diagnosis not present

## 2018-09-03 MED ORDER — PREDNISONE 20 MG PO TABS: 20.0000 mg | ORAL_TABLET | Freq: Two times a day (BID) | ORAL | 0 refills | Status: DC

## 2018-09-03 MED ORDER — PREDNISOLONE ACETATE 1 % OP SUSP: OPHTHALMIC | 1 refills | Status: DC

## 2018-09-03 NOTE — ED Provider Notes (Signed)
.  She has had eczematous dermatitis on her eyelids before.  She thinks she may be allergic to some make-ups, but is uncertain what.  She is seen dermatology.  She is seeing allergy.  She was on vacation and her eyelids broke out. [place]    CSN: [id number] Arrival date & time: 09/03/18  0757     History   Chief Complaint Chief Complaint  Patient presents with  . Eye Problem    HPI Bonnie Lester is a 48 y.o. female.   HPI  Patient is here for a rash on her eyelids.  She does have a history of dermatitis on her eyelids.  She states that she may be allergic to some make-up.  She has seen dermatology.  She has seen allergy.  She developed eyelid redness and swelling while she was on vacation.  She called a telemedicine doctor and was given erythromycin eye ointment.  The eyelids are actually worse now.  She has soft tissue swelling of the left eye.  She states she can hardly say when she first woke up this morning.  She put a cool compress on her eyes and came in.  No discharge from the eyes or redness of the globe.  No other rash  Past Medical History:  Diagnosis Date  . Deviated septum   . GERD (gastroesophageal reflux disease)   . Migraines    "guaranteed once/month; sporatic from there" (07/17/2016)  . Parotid tumor   . PONV (postoperative nausea and vomiting)   . Seasonal asthma     Patient Active Problem List   Diagnosis Date Noted  . Parotid mass 07/17/2016    Past Surgical History:  Procedure Laterality Date  . ENDOMETRIAL ABLATION  2009  . HAND SURGERY Left 2004   nerve repair  . INGUINAL HERNIA REPAIR Right 1999  . PAROTIDECTOMY Left 07/16/2016   WITH FACIAL NERVE DISSECTION AND NIMS MONITORING   . PAROTIDECTOMY Left 07/17/2016   Procedure: LEFT PAROTIDECTOMY WITH FACIAL NERVE DISSECTION AND NIMS MONITORING;  Surgeon: Jerrell Belfast, MD;  Location: [place];  Service: ENT;  Laterality: Left;  . TUBAL LIGATION  2001    OB History   No obstetric  history on file.      Home Medications    Prior to Admission medications   Medication Sig Start Date End Date Taking? Authorizing Provider  cetirizine (ZYRTEC) 10 MG tablet Take 10 mg by mouth every evening.    Yes [provider]  Cholecalciferol (VITAMIN D PO) Take 1 tablet by mouth 2 (two) times daily.    Yes [provider]  Magnesium 200 MG TABS Take 200 mg by mouth 2 (two) times daily.   Yes [provider]  SUMAtriptan (IMITREX) 100 MG tablet Take 100 mg by mouth 2 (two) times daily as needed for migraine. May repeat in 2 hours if headache persists or recurs.    Yes [provider]  albuterol (VENTOLIN HFA) 108 (90 Base) MCG/ACT inhaler Inhale two puffs every four to six hours as needed for cough or wheeze.    [provider]  doxycycline (VIBRA-TABS) 100 MG tablet Take 1 tablet (100 mg total) by mouth 2 (two) times daily. 06/15/18   [name], NP  montelukast (SINGULAIR) 10 MG tablet Take 1 tablet (10 mg total) by mouth at bedtime. 06/15/18 07/15/18  [name], NP  naproxen sodium (ANAPROX) 220 MG tablet Take 220-440 mg by mouth 2 (two) times daily as needed (  for migraine headache.).    [provider]  Olopatadine HCl (PAZEO) 0.7 % SOLN Apply 1 drop to eye daily as needed. for itchy eyes. 05/21/16   Kozlow, Donnamarie Poag, MD  ondansetron (ZOFRAN) 4 MG tablet Take 1 tablet (4 mg total) by mouth every 8 (eight) hours as needed for nausea or vomiting. 12/28/17   Hassell Done, Mary-Margaret, FNP  prednisoLONE acetate (PRED FORTE) 1 % ophthalmic suspension Rub into eyelids 4 times a day 09/03/18   Raylene Everts, MD  predniSONE (DELTASONE) 20 MG tablet Take 1 tablet (20 mg total) by mouth 2 (two) times daily with a meal. 09/03/18   Raylene Everts, MD  ranitidine (ZANTAC) 300 MG tablet TAKE 1 TABLET BY MOUTH AT  BEDTIME 04/29/18   Kozlow, Donnamarie Poag, MD  Riboflavin 100 MG TABS Take 100 mg by mouth 2 (two) times daily.     [provider]    Family History Family History  Problem Relation Age of Onset  . Depression Father   . Depression Sister   . Cancer Maternal Grandmother     Social History Social History   Tobacco Use  . Smoking status: Never Smoker  . Smokeless tobacco: Never Used  Substance Use Topics  . Alcohol use: No  . Drug use: No     Allergies   Penicillins   Review of Systems Review of Systems  Constitutional: Negative for chills and fever.  HENT: Negative for ear pain and sore throat.   Eyes: Negative for pain and visual disturbance.  Respiratory: Negative for cough and shortness of breath.   Cardiovascular: Negative for chest pain and palpitations.  Gastrointestinal: Negative for abdominal pain and vomiting.  Genitourinary: Negative for dysuria and hematuria.  Musculoskeletal: Negative for arthralgias and back pain.  Skin: Positive for rash. Negative for color change.  Neurological: Negative for seizures and syncope.  All other systems reviewed and are negative.    Physical Exam Triage Vital Signs ED Triage Vitals  Enc Vitals Group     BP 09/03/18 0820 111/79     Pulse Rate 09/03/18 0820 77     Resp 09/03/18 0820 16     Temp 09/03/18 0820 98.6 F (37 C)     Temp Source 09/03/18 0820 Oral     SpO2 09/03/18 0820 98 %     Weight --      Height --      Head Circumference --      Peak Flow --      Pain Score 09/03/18 0818 7     Pain Loc --      Pain Edu? --      Excl. in [place]? --    No data found.  Updated Vital Signs BP 111/79   Pulse 77   Temp 98.6 F (37 C) (Oral)   Resp 16   LMP 09/01/2018   SpO2 98%       Physical Exam Constitutional:      General: She is not in acute distress.    Appearance: She is well-developed.  HENT:     Head: Normocephalic and atraumatic.     Right Ear: Tympanic membrane and ear canal normal.     Left Ear: Tympanic membrane and ear canal normal.     Nose: Nose normal.     Mouth/Throat:     Mouth: Mucous  membranes are moist.  Eyes:     Conjunctiva/sclera: Conjunctivae normal.     Pupils: Pupils are equal, round, and  reactive to light.     Comments: Both eyelids are erythematous and puffy.  Left greater than right.  Fine scale.  No tenderness.  Some cobblestoning of the conjunctive inside the eyes.  No injection  Neck:     Musculoskeletal: Normal range of motion.  Cardiovascular:     Rate and Rhythm: Normal rate.  Pulmonary:     Effort: Pulmonary effort is normal. No respiratory distress.  Abdominal:     General: There is no distension.     Palpations: Abdomen is soft.  Musculoskeletal: Normal range of motion.  Skin:    General: Skin is warm and dry.  Neurological:     Mental Status: She is alert.      UC Treatments / Results  Labs (all labs ordered are listed, but only abnormal results are displayed) Labs Reviewed - No data to display  EKG None  Radiology No results found.  Procedures Procedures (including critical care time)  Medications Ordered in UC Medications - No data to display  Initial Impression / Assessment and Plan / UC Course  I have reviewed the triage vital signs and the nursing notes.  Pertinent labs & imaging results that were available during my care of the patient were reviewed by me and considered in my medical decision making (see chart for details).     Clearly an allergic or eczematous reaction.  Erythromycin will not help.  I will treat her with steroids, hope for improvement.  Follow-up with ophthalmology, consider additional allergy evaluation Final Clinical Impressions(s) / UC Diagnoses   Final diagnoses:  Eczematous dermatitis of upper eyelids of both eyes     Discharge Instructions     Use the pred eye drops as a lotion for the lids Cool compresses Take the oral prednisone as directed - may discontinue for side effects   ED Prescriptions    Medication Sig Dispense Auth. Provider   predniSONE (DELTASONE) 20 MG tablet Take 1  tablet (20 mg total) by mouth 2 (two) times daily with a meal. 10 tablet Raylene Everts, MD   prednisoLONE acetate (PRED FORTE) 1 % ophthalmic suspension Rub into eyelids 4 times a day 5 mL Raylene Everts, MD     Controlled Substance Prescriptions [place] Controlled Substance Registry consulted? Not Applicable   Raylene Everts, MD 09/03/18 2114

## 2018-09-03 NOTE — Discharge Instructions (Signed)
Use the pred eye drops as a lotion for the lids Cool compresses Take the oral prednisone as directed - may discontinue for side effects

## 2018-09-03 NOTE — ED Triage Notes (Signed)
PT has redness and swelling to both eyelids for over a week. PT did an E-visit a week ago and an antibiotic cream

## 2018-09-12 ENCOUNTER — Encounter (HOSPITAL_COMMUNITY): Payer: Self-pay | Admitting: Family Medicine

## 2018-09-12 ENCOUNTER — Ambulatory Visit (HOSPITAL_COMMUNITY)
Admission: EM | Admit: 2018-09-12 | Discharge: 2018-09-12 | Disposition: A | Discharge status: Home / Self Care | Payer: 59 | Attending: Family Medicine | Admitting: Family Medicine

## 2018-09-12 DIAGNOSIS — M5432 Sciatica, left side: Secondary | ICD-10-CM | POA: Insufficient documentation

## 2018-09-12 MED ORDER — CYCLOBENZAPRINE HCL 5 MG PO TABS: 5.0000 mg | ORAL_TABLET | Freq: Every day | ORAL | 0 refills | Status: DC

## 2018-09-12 MED ORDER — DICLOFENAC SODIUM 75 MG PO TBEC: 75.0000 mg | DELAYED_RELEASE_TABLET | Freq: Two times a day (BID) | ORAL | 0 refills | Status: DC

## 2018-09-12 MED ORDER — PREDNISONE 50 MG PO TABS: ORAL_TABLET | ORAL | 1 refills | Status: DC

## 2018-09-12 NOTE — ED Triage Notes (Signed)
Denies injury.  C/O intermittent low back pains x 1 yr.  6 days ago started with low back pain that is not improving with her usual interventions.  Denies parasthesias, but c/o pain radiating down into LLE.

## 2018-09-12 NOTE — Discharge Instructions (Addendum)
The medicines are a short term solution  I recommend seeing Barbaraann Barthel for a back evaluation and long term strategy

## 2018-09-12 NOTE — ED Provider Notes (Signed)
[place]    CSN: [id number] Arrival date & time: 09/12/18  1000     History   Chief Complaint Chief Complaint  Patient presents with  . Back Pain    HPI Bonnie Lester is a 48 y.o. female.   This is a 48 year old woman who is an established patient at [facility] urgent care and presents with a complaint of back pain.  She was seen just 9 days ago for eczema on her eyelids.  The eyelids are much better and she stopped taking the prednisone because it makes her feel bad.  Patient started developing back pain after moving awkwardly 1 week ago.  She is tried to work out the pain by walking a little bit more and instead the pain is gotten worse.  She notes that the pain radiates into the left buttock and proximal thigh.  She has had low back pain before but this is the worst it is been.  Patient has been under stress.  She works for [employer] and spends a lot of time sitting down.  Patient denies fever, muscle weakness, numbness, loss of bowel or bladder control.     Past Medical History:  Diagnosis Date  . Deviated septum   . GERD (gastroesophageal reflux disease)   . Migraines    "guaranteed once/month; sporatic from there" (07/17/2016)  . Parotid tumor   . PONV (postoperative nausea and vomiting)   . Seasonal asthma     Patient Active Problem List   Diagnosis Date Noted  . Parotid mass 07/17/2016    Past Surgical History:  Procedure Laterality Date  . ENDOMETRIAL ABLATION  2009  . HAND SURGERY Left 2004   nerve repair  . INGUINAL HERNIA REPAIR Right 1999  . PAROTIDECTOMY Left 07/16/2016   WITH FACIAL NERVE DISSECTION AND NIMS MONITORING   . PAROTIDECTOMY Left 07/17/2016   Procedure: LEFT PAROTIDECTOMY WITH FACIAL NERVE DISSECTION AND NIMS MONITORING;  Surgeon: Jerrell Belfast, MD;  Location: [place];  Service: ENT;  Laterality: Left;  . TUBAL LIGATION  2001    OB History   No obstetric history on file.      Home Medications    Prior to  Admission medications   Medication Sig Start Date End Date Taking? Authorizing Provider  cetirizine (ZYRTEC) 10 MG tablet Take 10 mg by mouth every evening.    Yes [provider]  SUMAtriptan (IMITREX) 100 MG tablet Take 100 mg by mouth 2 (two) times daily as needed for migraine. May repeat in 2 hours if headache persists or recurs.    Yes [provider]  albuterol (VENTOLIN HFA) 108 (90 Base) MCG/ACT inhaler Inhale two puffs every four to six hours as needed for cough or wheeze.    [provider]  cyclobenzaprine (FLEXERIL) 5 MG tablet Take 1 tablet (5 mg total) by mouth at bedtime. 09/12/18   [name], MD  diclofenac (VOLTAREN) 75 MG EC tablet Take 1 tablet (75 mg total) by mouth 2 (two) times daily. 09/12/18   [name], MD  montelukast (SINGULAIR) 10 MG tablet Take 1 tablet (10 mg total) by mouth at bedtime. 06/15/18 07/15/18  [name], NP  predniSONE (DELTASONE) 50 MG tablet One every morning with food 09/12/18   [name], MD    Family History Family History  Problem Relation Age of Onset  . Depression Father   . Depression Sister   . Cancer Maternal Grandmother     Social History Social History  Tobacco Use  . Smoking status: Never Smoker  . Smokeless tobacco: Never Used  Substance Use Topics  . Alcohol use: Not Currently  . Drug use: No     Allergies   Penicillins   Review of Systems Review of Systems   Physical Exam Triage Vital Signs ED Triage Vitals  Enc Vitals Group     BP      Pulse      Resp      Temp      Temp src      SpO2      Weight      Height      Head Circumference      Peak Flow      Pain Score      Pain Loc      Pain Edu?      Excl. in [place]?    No data found.  Updated Vital Signs BP 110/78   Pulse 75   Temp 98.4 F (36.9 C) (Oral)   Resp 16   LMP 09/01/2018   SpO2 98%    Physical Exam Vitals signs and nursing note reviewed.  Constitutional:      General: She is  in acute distress.     Appearance: Normal appearance. She is not ill-appearing or toxic-appearing.  HENT:     Head: Normocephalic.  Musculoskeletal:        General: Signs of injury present. No deformity.     Comments: Patient is very cautious when walking.  She does have some positive straight leg raising on the left.  Skin:    General: Skin is warm and dry.  Neurological:     General: No focal deficit present.     Mental Status: She is alert and oriented to person, place, and time.     Gait: Gait abnormal.  Psychiatric:        Mood and Affect: Mood normal.        Behavior: Behavior normal.      UC Treatments / Results  Labs (all labs ordered are listed, but only abnormal results are displayed) Labs Reviewed - No data to display  EKG None  Radiology No results found.  Procedures Procedures (including critical care time)  Medications Ordered in UC Medications - No data to display  Initial Impression / Assessment and Plan / UC Course  I have reviewed the triage vital signs and the nursing notes.  Pertinent labs & imaging results that were available during my care of the patient were reviewed by me and considered in my medical decision making (see chart for details).    Final Clinical Impressions(s) / UC Diagnoses   Final diagnoses:  Sciatica of left side     Discharge Instructions     The medicines are a short term solution  I recommend seeing Barbaraann Barthel for a back evaluation and long term strategy    ED Prescriptions    Medication Sig Dispense Auth. Provider   predniSONE (DELTASONE) 50 MG tablet One every morning with food 5 tablet [name], MD   cyclobenzaprine (FLEXERIL) 5 MG tablet Take 1 tablet (5 mg total) by mouth at bedtime. 7 tablet [name], MD   diclofenac (VOLTAREN) 75 MG EC tablet Take 1 tablet (75 mg total) by mouth 2 (two) times daily. 14 tablet [name], MD     Controlled Substance Prescriptions [place] Controlled  Substance Registry consulted? Not Applicable   [name], MD 09/12/18 1036

## 2018-09-14 DIAGNOSIS — M9903 Segmental and somatic dysfunction of lumbar region: Secondary | ICD-10-CM | POA: Diagnosis not present

## 2018-09-14 DIAGNOSIS — M9902 Segmental and somatic dysfunction of thoracic region: Secondary | ICD-10-CM | POA: Diagnosis not present

## 2018-09-14 DIAGNOSIS — M9905 Segmental and somatic dysfunction of pelvic region: Secondary | ICD-10-CM | POA: Diagnosis not present

## 2018-10-13 ENCOUNTER — Ambulatory Visit: Admission: EM | Admit: 2018-10-13 | Discharge: 2018-10-13 | Discharge status: Against Medical Advice | Payer: 59

## 2018-10-13 NOTE — ED Provider Notes (Signed)
[facility]    CSN: [id number] Arrival date & time: 10/13/18  1349     History   Chief Complaint Chief Complaint  Patient presents with  . Facial Pain    HPI Bonnie Lester is a 48 y.o. female.   Patient stormed out of her exam room.  Snatched off her ID bracelet and begin to tear her patient labels and throw them on the desk towards me. She informed me that she is tired of waiting and was leaving.  I apologized to the patient for her wait and informed her that she would be seen next but she decided to leave instead.       Past Medical History:  Diagnosis Date  . Deviated septum   . GERD (gastroesophageal reflux disease)   . Migraines    "guaranteed once/month; sporatic from there" (07/17/2016)  . Parotid tumor   . PONV (postoperative nausea and vomiting)   . Seasonal asthma     Patient Active Problem List   Diagnosis Date Noted  . Parotid mass 07/17/2016    Past Surgical History:  Procedure Laterality Date  . ENDOMETRIAL ABLATION  2009  . HAND SURGERY Left 2004   nerve repair  . INGUINAL HERNIA REPAIR Right 1999  . PAROTIDECTOMY Left 07/16/2016   WITH FACIAL NERVE DISSECTION AND NIMS MONITORING   . PAROTIDECTOMY Left 07/17/2016   Procedure: LEFT PAROTIDECTOMY WITH FACIAL NERVE DISSECTION AND NIMS MONITORING;  Surgeon: Bonnie Lester;  Location: [place];  Service: ENT;  Laterality: Left;  . TUBAL LIGATION  2001    OB History   No obstetric history on file.      Home Medications    Prior to Admission medications   Medication Sig Start Date End Date Taking? Authorizing Provider  albuterol (VENTOLIN HFA) 108 (90 Base) MCG/ACT inhaler Inhale two puffs every four to six hours as needed for cough or wheeze.    Provider, Historical, Lester  cetirizine (ZYRTEC) 10 MG tablet Take 10 mg by mouth every evening.     Provider, Historical, Lester  cyclobenzaprine (FLEXERIL) 5 MG tablet Take 1 tablet (5 mg total) by mouth at bedtime. 09/12/18   Bonnie Lester  diclofenac (VOLTAREN) 75 MG EC tablet Take 1 tablet (75 mg total) by mouth 2 (two) times daily. 09/12/18   Bonnie Lester  montelukast (SINGULAIR) 10 MG tablet Take 1 tablet (10 mg total) by mouth at bedtime. 06/15/18 07/15/18  Bonnie Lester  predniSONE (DELTASONE) 50 MG tablet One every morning with food 09/12/18   Bonnie Lester  SUMAtriptan (IMITREX) 100 MG tablet Take 100 mg by mouth 2 (two) times daily as needed for migraine. May repeat in 2 hours if headache persists or recurs.     Provider, Historical, Lester    Family History Family History  Problem Relation Age of Onset  . Depression Father   . Depression Sister   . Cancer Maternal Grandmother     Social History Social History   Tobacco Use  . Smoking status: Never Smoker  . Smokeless tobacco: Never Used  Substance Use Topics  . Alcohol use: Not Currently  . Drug use: No     Allergies   Penicillins   Review of Systems Review of Systems  Unable to perform ROS: Other     Physical Exam Triage Vital Signs ED Triage Vitals  Enc Vitals Group     BP 10/13/18 1454 105/75     Pulse Rate 10/13/18 1454  72     Resp 10/13/18 1454 18     Temp 10/13/18 1454 98.4 F (36.9 C)     Temp Source 10/13/18 1454 Oral     SpO2 10/13/18 1454 99 %     Weight --      Height --      Head Circumference --      Peak Flow --      Pain Score 10/13/18 1455 0     Pain Loc --      Pain Edu? --      Excl. in [place]? --    No data found.  Updated Vital Signs BP 105/75 (BP Location: Left Arm)   Pulse 72   Temp 98.4 F (36.9 C) (Oral)   Resp 18   LMP 09/30/2018   SpO2 99%   Visual Acuity Right Eye Distance:   Left Eye Distance:   Bilateral Distance:    Right Eye Near:   Left Eye Near:    Bilateral Near:     Physical Exam Nursing note reviewed.  Neurological:     General: No focal deficit present.     Mental Status: She is oriented to person, place, and time.      UC Treatments / Results    Labs (all labs ordered are listed, but only abnormal results are displayed) Labs Reviewed - No data to display  EKG None  Radiology No results found.  Procedures Procedures (including critical care time)  Medications Ordered in UC Medications - No data to display  Initial Impression / Assessment and Plan / UC Course  I have reviewed the triage vital signs and the nursing notes.  Pertinent labs & imaging results that were available during my care of the patient were reviewed by me and considered in my medical decision making (see chart for details).      Final Clinical Impressions(s) / UC Diagnoses   Final diagnoses:  None   Discharge Instructions   None    ED Prescriptions    None     Controlled Substance Prescriptions [place] Controlled Substance Registry consulted? Not Applicable   Bonnie Lester,  10/13/18 1538

## 2018-10-13 NOTE — ED Triage Notes (Signed)
Pt c/o chest congestion for over 6wks, now having bilateral ear pain and sinus drainage since sunday

## 2018-12-09 DIAGNOSIS — G43709 Chronic migraine without aura, not intractable, without status migrainosus: Secondary | ICD-10-CM | POA: Diagnosis not present

## 2018-12-09 DIAGNOSIS — H02402 Unspecified ptosis of left eyelid: Secondary | ICD-10-CM | POA: Diagnosis not present

## 2018-12-16 ENCOUNTER — Telehealth: Payer: Self-pay

## 2018-12-16 NOTE — Telephone Encounter (Signed)
Received fax from [facility] requesting replacement for Ranitidine. I faxed back message requesting that patient call office, she needs appointment.

## 2019-01-15 DIAGNOSIS — M9903 Segmental and somatic dysfunction of lumbar region: Secondary | ICD-10-CM | POA: Diagnosis not present

## 2019-01-15 DIAGNOSIS — M9905 Segmental and somatic dysfunction of pelvic region: Secondary | ICD-10-CM | POA: Diagnosis not present

## 2019-01-15 DIAGNOSIS — M9902 Segmental and somatic dysfunction of thoracic region: Secondary | ICD-10-CM | POA: Diagnosis not present

## 2019-02-17 ENCOUNTER — Other Ambulatory Visit: Payer: Self-pay

## 2019-02-17 ENCOUNTER — Encounter (HOSPITAL_COMMUNITY): Payer: Self-pay | Admitting: Emergency Medicine

## 2019-02-17 ENCOUNTER — Ambulatory Visit (HOSPITAL_COMMUNITY)
Admission: EM | Admit: 2019-02-17 | Discharge: 2019-02-17 | Disposition: A | Discharge status: Home / Self Care | Payer: 59 | Attending: Internal Medicine | Admitting: Internal Medicine

## 2019-02-17 ENCOUNTER — Ambulatory Visit (INDEPENDENT_AMBULATORY_CARE_PROVIDER_SITE_OTHER): Payer: 59

## 2019-02-17 ENCOUNTER — Telehealth: Payer: Self-pay | Admitting: General Practice

## 2019-02-17 DIAGNOSIS — R05 Cough: Secondary | ICD-10-CM

## 2019-02-17 DIAGNOSIS — Z20828 Contact with and (suspected) exposure to other viral communicable diseases: Secondary | ICD-10-CM

## 2019-02-17 DIAGNOSIS — R509 Fever, unspecified: Secondary | ICD-10-CM

## 2019-02-17 DIAGNOSIS — M791 Myalgia, unspecified site: Secondary | ICD-10-CM | POA: Diagnosis not present

## 2019-02-17 DIAGNOSIS — R0602 Shortness of breath: Secondary | ICD-10-CM

## 2019-02-17 DIAGNOSIS — Z20822 Contact with and (suspected) exposure to covid-19: Secondary | ICD-10-CM

## 2019-02-17 DIAGNOSIS — J4521 Mild intermittent asthma with (acute) exacerbation: Secondary | ICD-10-CM

## 2019-02-17 MED ORDER — PREDNISONE 10 MG PO TABS: 20.0000 mg | ORAL_TABLET | Freq: Every day | ORAL | 0 refills | Status: AC

## 2019-02-17 MED ORDER — ALBUTEROL SULFATE HFA 108 (90 BASE) MCG/ACT IN AERS: 2.0000 | INHALATION_SPRAY | Freq: Four times a day (QID) | RESPIRATORY_TRACT | 0 refills | Status: AC | PRN

## 2019-02-17 MED ORDER — BENZONATATE 100 MG PO CAPS: 100.0000 mg | ORAL_CAPSULE | Freq: Three times a day (TID) | ORAL | 0 refills | Status: DC

## 2019-02-17 MED ORDER — ONDANSETRON 4 MG PO TBDP: 4.0000 mg | ORAL_TABLET | Freq: Three times a day (TID) | ORAL | 0 refills | Status: DC | PRN

## 2019-02-17 NOTE — Addendum Note (Signed)
Addended by: Dimple Nanas on: 02/17/2019 03:59 PM   Modules accepted: Orders

## 2019-02-17 NOTE — ED Triage Notes (Signed)
Pt sts cough and pain with cough, SOB and fever over the weekend; pt sts body aches

## 2019-02-17 NOTE — Telephone Encounter (Signed)
Pt was scheduled for covid testing.  Scheduled with pt directly. Pt was referred GY:LUDAPTC, Myrene Galas, MD

## 2019-02-17 NOTE — Telephone Encounter (Signed)
-----   Message from Chase Picket, MD sent at 02/17/2019  1:28 PM EDT ----- Patient has body aches, sore throat and headaches and fever Patient will benefit from COVID-19 testing.

## 2019-02-17 NOTE — ED Provider Notes (Signed)
[place]    CSN: [id number] Arrival date & time: 02/17/19  1258     History   Chief Complaint Chief Complaint  Patient presents with   Cough   Generalized Body Aches    HPI JANVI AMMAR is a 48 y.o. female with past medical history of asthma comes to urgent care with complaints of a nonproductive cough, subjective fever and shortness of breath over the past 45 days.  Patient says symptoms started insidiously and is gotten more pronounced.  Body aches is generalized.  Aggravated by movement.  No known relieving factors.  Patient admits to having increased fatigue.  Cough is not productive of sputum.  She admits to having some wheezing.   HPI  Past Medical History:  Diagnosis Date   Deviated septum    GERD (gastroesophageal reflux disease)    Migraines    "guaranteed once/month; sporatic from there" (07/17/2016)   Parotid tumor    PONV (postoperative nausea and vomiting)    Seasonal asthma     Patient Active Problem List   Diagnosis Date Noted   Parotid mass 07/17/2016    Past Surgical History:  Procedure Laterality Date   ENDOMETRIAL ABLATION  2009   HAND SURGERY Left 2004   nerve repair   INGUINAL HERNIA REPAIR Right 1999   PAROTIDECTOMY Left 07/16/2016   WITH FACIAL NERVE DISSECTION AND NIMS MONITORING    PAROTIDECTOMY Left 07/17/2016   Procedure: LEFT PAROTIDECTOMY WITH FACIAL NERVE DISSECTION AND NIMS MONITORING;  Surgeon: Jerrell Belfast, MD;  Location: [facility];  Service: ENT;  Laterality: Left;   TUBAL LIGATION  2001    OB History   No obstetric history on file.      Home Medications    Prior to Admission medications   Medication Sig Start Date End Date Taking? Authorizing Provider  albuterol (VENTOLIN HFA) 108 (90 Base) MCG/ACT inhaler Inhale 2 puffs into the lungs every 6 (six) hours as needed for wheezing or shortness of breath. Inhale two puffs every four to six hours as needed for cough or wheeze. 02/17/19   Jeany Seville,  Myrene Galas, MD  benzonatate (TESSALON) 100 MG capsule Take 1 capsule (100 mg total) by mouth every 8 (eight) hours. 02/17/19   Chase Picket, MD  cetirizine (ZYRTEC) 10 MG tablet Take 10 mg by mouth every evening.     [provider]  montelukast (SINGULAIR) 10 MG tablet Take 1 tablet (10 mg total) by mouth at bedtime. 06/15/18 07/15/18  [name], NP  ondansetron (ZOFRAN ODT) 4 MG disintegrating tablet Take 1 tablet (4 mg total) by mouth every 8 (eight) hours as needed for nausea or vomiting. 02/17/19   Sharmarke Cicio, Myrene Galas, MD  SUMAtriptan (IMITREX) 100 MG tablet Take 100 mg by mouth 2 (two) times daily as needed for migraine. May repeat in 2 hours if headache persists or recurs.     [provider]    Family History Family History  Problem Relation Age of Onset   Depression Father    Depression Sister    Cancer Maternal Grandmother     Social History Social History   Tobacco Use   Smoking status: Never Smoker   Smokeless tobacco: Never Used  Substance Use Topics   Alcohol use: Not Currently   Drug use: No     Allergies   Penicillins   Review of Systems Review of Systems  Constitutional: Positive for activity change, appetite change, chills, fatigue and fever.  HENT: Positive for  congestion and rhinorrhea. Negative for sinus pressure, sinus pain, sneezing, sore throat and voice change.   Eyes: Negative.   Respiratory: Positive for cough, shortness of breath and wheezing. Negative for apnea and chest tightness.   Cardiovascular: Negative.   Gastrointestinal: Positive for nausea. Negative for abdominal distention, abdominal pain and vomiting.  Endocrine: Negative.   Genitourinary: Negative.   Musculoskeletal: Positive for arthralgias.  Skin: Negative.   Neurological: Positive for weakness and headaches. Negative for dizziness and light-headedness.  Psychiatric/Behavioral: Negative.   All other systems reviewed and are  negative.    Physical Exam Triage Vital Signs ED Triage Vitals  Enc Vitals Group     BP 02/17/19 1313 104/61     Pulse Rate 02/17/19 1313 72     Resp 02/17/19 1313 18     Temp 02/17/19 1313 98.1 F (36.7 C)     Temp Source 02/17/19 1313 Oral     SpO2 02/17/19 1313 99 %     Weight --      Height --      Head Circumference --      Peak Flow --      Pain Score 02/17/19 1314 5     Pain Loc --      Pain Edu? --      Excl. in [place]? --    No data found.  Updated Vital Signs BP 104/61 (BP Location: Right Arm)    Pulse 72    Temp 98.1 F (36.7 C) (Oral)    Resp 18    LMP 01/17/2019    SpO2 99%   Visual Acuity Right Eye Distance:   Left Eye Distance:   Bilateral Distance:    Right Eye Near:   Left Eye Near:    Bilateral Near:     Physical Exam Constitutional:      General: She is not in acute distress.    Appearance: She is ill-appearing. She is not toxic-appearing.  HENT:     Head: Normocephalic and atraumatic.     Mouth/Throat:     Pharynx: No posterior oropharyngeal erythema.  Eyes:     Conjunctiva/sclera: Conjunctivae normal.  Cardiovascular:     Rate and Rhythm: Normal rate and regular rhythm.     Pulses: Normal pulses.     Heart sounds: Normal heart sounds.  Pulmonary:     Effort: Pulmonary effort is normal. No respiratory distress.     Breath sounds: No stridor. No wheezing, rhonchi or rales.  Chest:     Chest wall: No tenderness.  Abdominal:     General: Bowel sounds are normal.     Palpations: Abdomen is soft.  Musculoskeletal: Normal range of motion.        General: No swelling, tenderness, deformity or signs of injury.  Skin:    Capillary Refill: Capillary refill takes less than 2 seconds.  Neurological:     General: No focal deficit present.     Mental Status: She is alert and oriented to person, place, and time.      UC Treatments / Results  Labs (all labs ordered are listed, but only abnormal results are displayed) Labs Reviewed - No data  to display  EKG None  Radiology No results found.  Procedures Procedures (including critical care time)  Medications Ordered in UC Medications - No data to display  Initial Impression / Assessment and Plan / UC Course  I have reviewed the triage vital signs and the nursing notes.  Pertinent labs & imaging  results that were available during my care of the patient were reviewed by me and considered in my medical decision making (see chart for details).     1.  Suspected COVID-19 infection: Chest x-ray dependently reviewed by me is negative for any acute lung infiltrate Referral for COVID-19 testing Tessalon Perles as needed for cough Zofran as needed for nausea/vomiting Patient is advised to self quarantine.  2.  Mild intermittent asthma with exacerbation: Prednisone 20 mg orally daily for 5 days Albuterol inhaler every 6 as needed for shortness of breath/wheezing Final Clinical Impressions(s) / UC Diagnoses   Final diagnoses:  Suspected Covid-19 Virus Infection   Discharge Instructions   None    ED Prescriptions    Medication Sig Dispense Auth. Provider   albuterol (VENTOLIN HFA) 108 (90 Base) MCG/ACT inhaler Inhale 2 puffs into the lungs every 6 (six) hours as needed for wheezing or shortness of breath. Inhale two puffs every four to six hours as needed for cough or wheeze. 1 Inhaler Rumaisa Schnetzer, Myrene Galas, MD   ondansetron (ZOFRAN ODT) 4 MG disintegrating tablet Take 1 tablet (4 mg total) by mouth every 8 (eight) hours as needed for nausea or vomiting. 20 tablet Terrea Bruster, Myrene Galas, MD   benzonatate (TESSALON) 100 MG capsule Take 1 capsule (100 mg total) by mouth every 8 (eight) hours. 21 capsule Shimeka Bacot, Myrene Galas, MD     Controlled Substance Prescriptions [place] Controlled Substance Registry consulted? No   Chase Picket, MD 02/17/19 [phone]

## 2019-02-18 ENCOUNTER — Other Ambulatory Visit: Payer: 59

## 2019-02-18 DIAGNOSIS — Z20822 Contact with and (suspected) exposure to covid-19: Secondary | ICD-10-CM

## 2019-02-22 LAB — NOVEL CORONAVIRUS, NAA: SARS-CoV-2, NAA: NOT DETECTED

## 2019-02-23 ENCOUNTER — Ambulatory Visit: Payer: Self-pay

## 2019-02-23 NOTE — Telephone Encounter (Signed)
  Pt called for understanding of COVID-19.  She states that she was the first in her family to have symptoms but is now told her results are negative.  She states her daughter and husband now have the symptoms. She does not have a primary care physician and refused to be scheduled. Pt was read the 3 criteria for stopping isolation. She was told to go to [facility] for sob difficulty breathing and chest pain. She was instructed to call ahead.  She verbalized understanding of all instructions. Reason for Disposition . Health Information question, no triage required and triager able to answer question  Answer Assessment - Initial Assessment Questions 1. REASON FOR CALL or QUESTION: "What is your reason for calling today?" or "How can I best help you?" or "What question do you have that I can help answer?"     I had symptoms of Covid-19  but my results are negative  Protocols used: INFORMATION ONLY CALL-A-AH

## 2019-11-13 ENCOUNTER — Ambulatory Visit: Payer: Self-pay | Attending: Internal Medicine

## 2019-11-13 DIAGNOSIS — Z23 Encounter for immunization: Secondary | ICD-10-CM

## 2019-11-13 NOTE — Progress Notes (Signed)
   Covid-19 Vaccination Clinic  Name:  Bonnie Lester    MRN: [place]:[id number] DOB: [birth year]  11/13/2019  [name] was observed post Covid-19 immunization for 15 minutes without incident. She was provided with Vaccine Information Sheet and instruction to access the V-Safe system.   [name] was instructed to call 911 with any severe reactions post vaccine: Marland Kitchen Difficulty breathing  . Swelling of face and throat  . A fast heartbeat  . A bad rash all over body  . Dizziness and weakness   Immunizations Administered    Name Date Dose VIS Date Route   Pfizer COVID-19 Vaccine 11/13/2019  5:15 PM 0.3 mL 08/13/2019 Intramuscular   Manufacturer: [place]   Lot: [id number]   [place]: [id number]

## 2019-12-08 ENCOUNTER — Ambulatory Visit: Payer: Self-pay | Attending: Internal Medicine

## 2019-12-08 DIAGNOSIS — Z23 Encounter for immunization: Secondary | ICD-10-CM

## 2019-12-08 NOTE — Progress Notes (Signed)
   Covid-19 Vaccination Clinic  Name:  Bonnie Lester    MRN: [place]:[id number] DOB: 06/02/1971  12/08/2019  [name] was observed post Covid-19 immunization for 15 minutes without incident. She was provided with Vaccine Information Sheet and instruction to access the V-Safe system.   [name] was instructed to call 911 with any severe reactions post vaccine: Marland Kitchen Difficulty breathing  . Swelling of face and throat  . A fast heartbeat  . A bad rash all over body  . Dizziness and weakness   Immunizations Administered    Name Date Dose VIS Date Route   Pfizer COVID-19 Vaccine 12/08/2019 12:43 PM 0.3 mL 08/13/2019 Intramuscular   Manufacturer: [place]   Lot: [id number]   [place]: [id number]

## 2019-12-24 ENCOUNTER — Ambulatory Visit: Payer: Self-pay | Admitting: Family Medicine

## 2021-03-21 ENCOUNTER — Other Ambulatory Visit: Payer: Self-pay | Admitting: Obstetrics and Gynecology

## 2021-03-21 DIAGNOSIS — R928 Other abnormal and inconclusive findings on diagnostic imaging of breast: Secondary | ICD-10-CM

## 2021-04-03 ENCOUNTER — Other Ambulatory Visit: Payer: Self-pay

## 2021-04-03 ENCOUNTER — Other Ambulatory Visit: Payer: Self-pay | Admitting: Obstetrics and Gynecology

## 2021-04-03 ENCOUNTER — Ambulatory Visit
Admission: RE | Admit: 2021-04-03 | Discharge: 2021-04-03 | Disposition: A | Discharge status: Home / Self Care | Payer: No Typology Code available for payment source | Source: Ambulatory Visit | Attending: Obstetrics and Gynecology | Admitting: Obstetrics and Gynecology

## 2021-04-03 DIAGNOSIS — R928 Other abnormal and inconclusive findings on diagnostic imaging of breast: Secondary | ICD-10-CM

## 2021-04-03 DIAGNOSIS — R921 Mammographic calcification found on diagnostic imaging of breast: Secondary | ICD-10-CM

## 2021-04-13 ENCOUNTER — Other Ambulatory Visit: Payer: Self-pay | Admitting: Obstetrics and Gynecology

## 2021-04-13 ENCOUNTER — Ambulatory Visit
Admission: RE | Admit: 2021-04-13 | Discharge: 2021-04-13 | Disposition: A | Discharge status: Home / Self Care | Payer: No Typology Code available for payment source | Source: Ambulatory Visit | Attending: Obstetrics and Gynecology | Admitting: Obstetrics and Gynecology

## 2021-04-13 ENCOUNTER — Other Ambulatory Visit: Payer: Self-pay

## 2021-04-13 DIAGNOSIS — R921 Mammographic calcification found on diagnostic imaging of breast: Secondary | ICD-10-CM

## 2021-09-04 ENCOUNTER — Ambulatory Visit: Payer: Self-pay | Attending: Internal Medicine

## 2021-09-04 ENCOUNTER — Other Ambulatory Visit (HOSPITAL_BASED_OUTPATIENT_CLINIC_OR_DEPARTMENT_OTHER): Payer: Self-pay

## 2021-09-04 DIAGNOSIS — Z23 Encounter for immunization: Secondary | ICD-10-CM

## 2021-09-04 MED ORDER — PFIZER COVID-19 VAC BIVALENT 30 MCG/0.3ML IM SUSP
INTRAMUSCULAR | 0 refills | Status: DC
  Filled 2021-09-04: qty 0.3, 1d supply, fill #0

## 2021-09-04 NOTE — Progress Notes (Signed)
° °  Covid-19 Vaccination Clinic  Name:  Bonnie Lester    MRN: [id number] DOB: December 02, 1970  09/04/2021  [name] was observed post Covid-19 immunization for 15 minutes without incident. She was provided with Vaccine Information Sheet and instruction to access the V-Safe system.   [name] was instructed to call 911 with any severe reactions post vaccine: Difficulty breathing  Swelling of face and throat  A fast heartbeat  A bad rash all over body  Dizziness and weakness   Immunizations Administered     Name Date Dose VIS Date Route   Pfizer Covid-19 Vaccine Bivalent Booster 09/04/2021  1:01 PM 0.3 mL 05/02/2021 Intramuscular   Manufacturer: [place]   Lot: [id number]   [place]: [phone]

## 2021-11-12 ENCOUNTER — Emergency Department (HOSPITAL_BASED_OUTPATIENT_CLINIC_OR_DEPARTMENT_OTHER): Payer: No Typology Code available for payment source | Admitting: Radiology

## 2021-11-12 ENCOUNTER — Emergency Department (HOSPITAL_BASED_OUTPATIENT_CLINIC_OR_DEPARTMENT_OTHER)
Admission: EM | Admit: 2021-11-12 | Discharge: 2021-11-12 | Disposition: A | Discharge status: Home / Self Care | Payer: No Typology Code available for payment source | Attending: Emergency Medicine | Admitting: Emergency Medicine

## 2021-11-12 ENCOUNTER — Other Ambulatory Visit: Payer: Self-pay

## 2021-11-12 ENCOUNTER — Encounter (HOSPITAL_BASED_OUTPATIENT_CLINIC_OR_DEPARTMENT_OTHER): Payer: Self-pay

## 2021-11-12 DIAGNOSIS — J45909 Unspecified asthma, uncomplicated: Secondary | ICD-10-CM | POA: Insufficient documentation

## 2021-11-12 DIAGNOSIS — R0789 Other chest pain: Secondary | ICD-10-CM | POA: Insufficient documentation

## 2021-11-12 DIAGNOSIS — R059 Cough, unspecified: Secondary | ICD-10-CM | POA: Diagnosis not present

## 2021-11-12 DIAGNOSIS — M546 Pain in thoracic spine: Secondary | ICD-10-CM | POA: Diagnosis not present

## 2021-11-12 DIAGNOSIS — R0602 Shortness of breath: Secondary | ICD-10-CM | POA: Diagnosis not present

## 2021-11-12 DIAGNOSIS — M549 Dorsalgia, unspecified: Secondary | ICD-10-CM

## 2021-11-12 LAB — CBC
HCT: 41.6 % (ref 36.0–46.0)
Hemoglobin: 13.7 g/dL (ref 12.0–15.0)
MCH: 30.6 pg (ref 26.0–34.0)
MCHC: 32.9 g/dL (ref 30.0–36.0)
MCV: 93.1 fL (ref 80.0–100.0)
Platelets: 324 10*3/uL (ref 150–400)
RBC: 4.47 MIL/uL (ref 3.87–5.11)
RDW: 12.7 % (ref 11.5–15.5)
WBC: 6.8 10*3/uL (ref 4.0–10.5)
nRBC: 0 % (ref 0.0–0.2)

## 2021-11-12 LAB — BASIC METABOLIC PANEL
Anion gap: 9 (ref 5–15)
BUN: 13 mg/dL (ref 6–20)
CO2: 25 mmol/L (ref 22–32)
Calcium: 9.7 mg/dL (ref 8.9–10.3)
Chloride: 107 mmol/L (ref 98–111)
Creatinine, Ser: 1.08 mg/dL — ABNORMAL HIGH (ref 0.44–1.00)
GFR, Estimated: >60 mL/min (ref >60)
Glucose, Bld: 103 mg/dL — ABNORMAL HIGH (ref 70–99)
Potassium: 3.3 mmol/L — ABNORMAL LOW (ref 3.5–5.1)
Sodium: 141 mmol/L (ref 135–145)

## 2021-11-12 LAB — TROPONIN I (HIGH SENSITIVITY)
Troponin I (High Sensitivity): <2 ng/L (ref <18)
Troponin I (High Sensitivity): <2 ng/L (ref <18)

## 2021-11-12 MED ORDER — PANTOPRAZOLE SODIUM 40 MG PO TBEC: 40.0000 mg | DELAYED_RELEASE_TABLET | Freq: Every day | ORAL | 1 refills | Status: DC

## 2021-11-12 MED ORDER — PANTOPRAZOLE SODIUM 40 MG PO TBEC: 40.0000 mg | DELAYED_RELEASE_TABLET | Freq: Once | ORAL | Status: DC

## 2021-11-12 NOTE — ED Triage Notes (Signed)
Pt presents with central chest pressure and SOB x2 months. Pt reports the symptoms worsened over the last few days. Pt reports a hx of asthma, getting no relief with her inhaler. [facility], they sent her here for an abnormal EKG  ?

## 2021-11-12 NOTE — ED Notes (Signed)
Pt verbalizes understanding of discharge instructions. Opportunity for questioning and answers were provided. Pt discharged from ED to home with husband.    

## 2021-11-12 NOTE — ED Provider Notes (Signed)
[facility] Provider Note: Georgena Spurling, MD, FACEP  CSN: [id number] MRN: [id number] ARRIVAL: 11/12/21 at [place]: [place]  Chest Pain and Shortness of Breath   HISTORY OF PRESENT ILLNESS  11/12/21 11:01 PM Bonnie Lester is a 51 y.o. female with a history of asthma.  She has had about 2 months of intermittent shortness of breath with chest burning.  The chest burning occurs when she takes a deep breath or coughs.  Symptoms are worse in the morning.  Her shortness of breath improves with use of her inhaler but she cannot get her symptoms fully controlled.  She was seen at an urgent care about 3 weeks ago and was placed on an antibiotic and steroids.  This did not relieve her symptoms.  She went back to the urgent care today where an EKG was done.  EKG was not normal and she was sent here for further evaluation.  She rates the discomfort in her chest as a 6 out of 10.  She is able to exert herself as long as she treats her symptoms with an albuterol inhaler.  She does have a history of GERD but only takes GERD medication sporadically.  She has also been having some mild upper back pain in her thoracic paraspinal muscles.  Symptoms are worse with movement of her arms.   Past Medical History:  Diagnosis Date   Deviated septum    GERD (gastroesophageal reflux disease)    Migraines    "guaranteed once/month; sporatic from there" (07/17/2016)   Parotid tumor    PONV (postoperative nausea and vomiting)    Seasonal asthma     Past Surgical History:  Procedure Laterality Date   ENDOMETRIAL ABLATION  2009   HAND SURGERY Left 2004   nerve repair   INGUINAL HERNIA REPAIR Right 1999   PAROTIDECTOMY Left 07/16/2016   WITH FACIAL NERVE DISSECTION AND NIMS MONITORING    PAROTIDECTOMY Left 07/17/2016   Procedure: LEFT PAROTIDECTOMY WITH FACIAL NERVE DISSECTION AND NIMS MONITORING;  Surgeon: Jerrell Belfast, MD;  Location: [facility];  Service: ENT;  Laterality: Left;    TUBAL LIGATION  2001    Family History  Problem Relation Age of Onset   Depression Father    Depression Sister    Cancer Maternal Grandmother     Social History   Tobacco Use   Smoking status: Never   Smokeless tobacco: Never  Vaping Use   Vaping Use: Never used  Substance Use Topics   Alcohol use: Not Currently   Drug use: No    Prior to Admission medications   Medication Sig Start Date End Date Taking? Authorizing Provider  pantoprazole (PROTONIX) 40 MG tablet Take 1 tablet (40 mg total) by mouth daily. 11/12/21  Yes Woodrow Drab, MD  albuterol (VENTOLIN HFA) 108 (90 Base) MCG/ACT inhaler Inhale 2 puffs into the lungs every 6 (six) hours as needed for wheezing or shortness of breath. Inhale two puffs every four to six hours as needed for cough or wheeze. 02/17/19   Lamptey, Myrene Galas, MD  benzonatate (TESSALON) 100 MG capsule Take 1 capsule (100 mg total) by mouth every 8 (eight) hours. 02/17/19   Chase Picket, MD  cetirizine (ZYRTEC) 10 MG tablet Take 10 mg by mouth every evening.     [provider]  COVID-19 mRNA bivalent vaccine, Pfizer, (PFIZER COVID-19 VAC BIVALENT) injection Inject into the muscle. 09/04/21   Carlyle Basques, MD  montelukast (SINGULAIR) 10 MG tablet  Take 1 tablet (10 mg total) by mouth at bedtime. 06/15/18 07/15/18  Leath-Warren, Alda Lea, NP  ondansetron (ZOFRAN ODT) 4 MG disintegrating tablet Take 1 tablet (4 mg total) by mouth every 8 (eight) hours as needed for nausea or vomiting. 02/17/19   Lamptey, Myrene Galas, MD  SUMAtriptan (IMITREX) 100 MG tablet Take 100 mg by mouth 2 (two) times daily as needed for migraine. May repeat in 2 hours if headache persists or recurs.     [provider]    Allergies Epinephrine and Penicillins   REVIEW OF SYSTEMS  Negative except as noted here or in the History of Present Illness.   PHYSICAL EXAMINATION  Initial Vital Signs Blood pressure 114/84, pulse 68, temperature 98.4 F (36.9 C),  resp. rate 17, SpO2 100 %.  Examination General: Well-developed, well-nourished female in no acute distress; appearance consistent with age of record HENT: normocephalic; atraumatic Eyes: pupils equal, round and reactive to light; extraocular muscles intact Neck: supple Heart: regular rate and rhythm Lungs: clear to auscultation bilaterally Abdomen: soft; nondistended; nontender; bowel sounds present Back: Mild tenderness of thoracic paraspinal muscles Extremities: No deformity; full range of motion; pulses normal Neurologic: Awake, alert and oriented; motor function intact in all extremities and symmetric; no facial droop Skin: Warm and dry Psychiatric: Normal mood and affect   RESULTS  Summary of this visit's results, reviewed and interpreted by myself:   EKG Interpretation  Date/Time:  Monday November 12 2021 18:31:33 EDT Ventricular Rate:  69 PR Interval:  164 QRS Duration: 82 QT Interval:  390 QTC Calculation: 417 R Axis:   61 Text Interpretation: Normal sinus rhythm ST & T wave abnormality, consider inferior ischemia Abnormal ECG No previous ECGs available Confirmed by Sherwood Gambler [phone]) on 11/12/2021 6:33:23 PM       Laboratory Studies: Results for orders placed or performed during the hospital encounter of 11/12/21 (from the past 24 hour(s))  Basic metabolic panel     Status: Abnormal   Collection Time: 11/12/21  6:32 PM  Result Value Ref Range   Sodium 141 135 - 145 mmol/L   Potassium 3.3 (L) 3.5 - 5.1 mmol/L   Chloride 107 98 - 111 mmol/L   CO2 25 22 - 32 mmol/L   Glucose, Bld 103 (H) 70 - 99 mg/dL   BUN 13 6 - 20 mg/dL   Creatinine, Ser 1.08 (H) 0.44 - 1.00 mg/dL   Calcium 9.7 8.9 - 10.3 mg/dL   GFR, Estimated >60 >60 mL/min   Anion gap 9 5 - 15  CBC     Status: None   Collection Time: 11/12/21  6:32 PM  Result Value Ref Range   WBC 6.8 4.0 - 10.5 K/uL   RBC 4.47 3.87 - 5.11 MIL/uL   Hemoglobin 13.7 12.0 - 15.0 g/dL   HCT 41.6 36.0 - 46.0 %   MCV  93.1 80.0 - 100.0 fL   MCH 30.6 26.0 - 34.0 pg   MCHC 32.9 30.0 - 36.0 g/dL   RDW 12.7 11.5 - 15.5 %   Platelets 324 150 - 400 K/uL   nRBC 0.0 0.0 - 0.2 %  Troponin I (High Sensitivity)     Status: None   Collection Time: 11/12/21  6:32 PM  Result Value Ref Range   Troponin I (High Sensitivity) <2 <18 ng/L  Troponin I (High Sensitivity)     Status: None   Collection Time: 11/12/21  9:30 PM  Result Value Ref Range   Troponin I (High  Sensitivity) <2 <18 ng/L   Imaging Studies: DG Chest 2 View  Result Date: 11/12/2021 CLINICAL DATA:  Chest pain, chest pressure and shortness of breath for 2 months worsened over past few days, abnormal EKG, history asthma EXAM: CHEST - 2 VIEW COMPARISON:  02/17/2019 FINDINGS: Normal heart size, mediastinal contours, and pulmonary vascularity. Lungs clear. No pleural effusion or pneumothorax. Bones unremarkable. IMPRESSION: Normal exam. Electronically Signed   By: Lavonia Dana M.D.   On: 11/12/2021 18:46    ED COURSE and MDM  Nursing notes, initial and subsequent vitals signs, including pulse oximetry, reviewed and interpreted by myself.  Vitals:   11/12/21 1833 11/12/21 2137 11/12/21 2215 11/12/21 2300  BP: 115/78 131/80 114/84 110/74  Pulse: 69 68 68 74  Resp: '18 18 17 18  '$ Temp: 98.4 F (36.9 C)     SpO2: 100% 100% 100% 99%   Medications  pantoprazole (PROTONIX) EC tablet 40 mg (has no administration in time range)    The patient's symptoms are primarily respiratory in nature.  Her chest burning is related to coughing or deep breathing.  When she has a cough it is nonproductive.  She has not had a fever.  Her troponins are normal.  Her EKG does show some inferior ST/T changes but it is unknown if these are acute or chronic as there is no previous EKG available.  Since her symptoms have been ongoing for about 2 months I do not believe emergent intervention is required at this time.  GERD may be a factor as her symptoms are worse in the morning and the  acid may be triggering the chest burning and shortness of breath.  We will start her on a PPI.  We will refer her to pulmonology and cardiology for further evaluation.  PROCEDURES  Procedures   ED DIAGNOSES     ICD-10-CM   1. Shortness of breath  R06.02     2. Burning in the chest  R07.89     3. Upper back pain  M54.9          Leslie Jester, Jenny Reichmann, MD 11/12/21 2324

## 2021-11-28 NOTE — Progress Notes (Signed)
? ? ?Chief Complaint  ?Patient presents with  ? New Patient (Initial Visit)  ?  Chest pain  ? ?History of Present Illness: 51 yo female with history of asthma, GERD and migraine headaches who is here today as a new patient for the evaluation of chest pain and dyspnea. She went to Urgent Care for an "asthma attack" on 11/12/21 and her EKG was abnormal. She was sent to the [facility] ED  for evaluation. Troponin negative. EKG with  sinus with diffuse T wave flattening. Chest x-ray was normal. Her chest pain was felt to be non-cardiac and outpatient cardiology referral was arranged.  ? ?She tells me today that she has had increased dyspnea over the past few weeks. She feels like this is her asthma. She has some chest burning, fatigue and in general feels weak. She has been very active over the past few years, hiking the [place] last year but even walking her dog now is exhausting.  ? ?Primary Care Physician: Patient, No Pcp Per (Inactive) ? ?Past Medical History:  ?Diagnosis Date  ? Deviated septum   ? GERD (gastroesophageal reflux disease)   ? Migraines   ? "guaranteed once/month; sporatic from there" (07/17/2016)  ? Parotid tumor   ? PONV (postoperative nausea and vomiting)   ? Seasonal asthma   ? ? ?Past Surgical History:  ?Procedure Laterality Date  ? BREAST REDUCTION SURGERY    ? ENDOMETRIAL ABLATION  2009  ? HAND SURGERY Left 2004  ? nerve repair  ? INGUINAL HERNIA REPAIR Right 1999  ? PAROTIDECTOMY Left 07/16/2016  ? WITH FACIAL NERVE DISSECTION AND NIMS MONITORING   ? PAROTIDECTOMY Left 07/17/2016  ? Procedure: LEFT PAROTIDECTOMY WITH FACIAL NERVE DISSECTION AND NIMS MONITORING;  Surgeon: Jerrell Belfast, MD;  Location: [place];  Service: ENT;  Laterality: Left;  ? TUBAL LIGATION  2001  ? ? ?Current Outpatient Medications  ?Medication Sig Dispense Refill  ? albuterol (VENTOLIN HFA) 108 (90 Base) MCG/ACT inhaler Inhale 2 puffs into the lungs every 6 (six) hours as needed for wheezing or shortness of  breath. Inhale two puffs every four to six hours as needed for cough or wheeze. 1 Inhaler 0  ? pantoprazole (PROTONIX) 40 MG tablet Take 1 tablet (40 mg total) by mouth daily. 30 tablet 1  ? SUMAtriptan (IMITREX) 100 MG tablet Take 100 mg by mouth 2 (two) times daily as needed for migraine. May repeat in 2 hours if headache persists or recurs.     ? topiramate (TOPAMAX) 100 MG tablet Take 1 tablet by mouth daily.    ? cyclobenzaprine (FLEXERIL) 10 MG tablet Take 10 mg by mouth 3 (three) times daily as needed.    ? MAGNESIUM PO Take 200 mg by mouth daily.    ? naproxen sodium (ALEVE) 220 MG tablet Take 1 tablet by mouth as needed.    ? NURTEC 75 MG TBDP Take 1 tablet by mouth as needed.    ? riboflavin (VITAMIN B-2) 100 MG TABS tablet Take 1 tablet by mouth daily.    ? ?No current facility-administered medications for this visit.  ? ? ?Allergies  ?Allergen Reactions  ? Epinephrine Nausea Only and Palpitations  ?  Feeling faint  ? Penicillins Other (See Comments)  ?  Headache & neck pain ? ?Has patient had a PCN reaction causing immediate rash, facial/tongue/throat swelling, SOB or lightheadedness with hypotension: No  ?Has patient had a PCN reaction causing severe rash involving mucus membranes or skin necrosis: No ?Has  patient had a PCN reaction that required hospitalization No ?Has patient had a PCN reaction occurring within the last 10 years: No ?If all of the above answers are "NO", then may proceed with Cephalosporin use. ?  ? ? ?Social History  ? ?Socioeconomic History  ? Marital status: Married  ?  Spouse name: Not on file  ? Number of children: 2  ? Years of education: Not on file  ? Highest education level: Not on file  ?Occupational History  ? Occupation: Music therapist  ?Tobacco Use  ? Smoking status: Never  ? Smokeless tobacco: Never  ?Vaping Use  ? Vaping Use: Never used  ?Substance and Sexual Activity  ? Alcohol use: Not Currently  ? Drug use: No  ? Sexual activity: Not on file  ?Other Topics Concern   ? Not on file  ?Social History Narrative  ? Not on file  ? ?Social Determinants of Health  ? ?Financial Resource Strain: Not on file  ?Food Insecurity: Not on file  ?Transportation Needs: Not on file  ?Physical Activity: Not on file  ?Stress: Not on file  ?Social Connections: Not on file  ?Intimate Partner Violence: Not on file  ? ? ?Family History  ?Problem Relation Age of Onset  ? Depression Father   ? Depression Sister   ? Heart attack Sister   ?     Cocaine abuse  ? Cancer Maternal Grandmother   ? ? ?Review of Systems:  As stated in the HPI and otherwise negative.  ? ?BP 116/72   Pulse 61   Ht '5\' 4"'$  (1.626 m)   Wt 149 lb 6.4 oz (67.8 kg)   SpO2 98%   BMI 25.64 kg/m?  ? ?Physical Examination: ?General: Well developed, well nourished, NAD  ?HEENT: OP clear, mucus membranes moist  ?SKIN: warm, dry. No rashes. ?Neuro: No focal deficits  ?Musculoskeletal: Muscle strength 5/5 all ext  ?Psychiatric: Mood and affect normal  ?Neck: No JVD, no carotid bruits, no thyromegaly, no lymphadenopathy.  ?Lungs:Clear bilaterally, no wheezes, rhonci, crackles ?Cardiovascular: Regular rate and rhythm. No murmurs, gallops or rubs. ?Abdomen:Soft. Bowel sounds present. Non-tender.  ?Extremities: No lower extremity edema. Pulses are 2 + in the bilateral DP/PT. ? ?EKG:  EKG is ordered today. ?The ekg ordered today demonstrates sinus, diffuse T wave flattening and inversion, unchanged from 11/12/21.  ? ?Recent Labs: ?11/12/2021: BUN 13; Creatinine, Ser 1.08; Hemoglobin 13.7; Platelets 324; Potassium 3.3; Sodium 141  ? ?Lipid Panel ?No results found for: CHOL, TRIG, HDL, CHOLHDL, VLDL, LDLCALC, LDLDIRECT ?  ?Wt Readings from Last 3 Encounters:  ?11/29/21 149 lb 6.4 oz (67.8 kg)  ?06/15/18 165 lb 9.6 oz (75.1 kg)  ?12/28/17 161 lb 1.6 oz (73.1 kg)  ?  ?Assessment and Plan:  ? ?1. Dyspnea on exertion/Chest pain/Abnormal EKG: She has an abnormal EKG in the setting of unexplained new dyspnea, chest burning and fatigue. Will arrange an echo  to assess LVEF and exclude structural heart disease. Will arrange a coronary CTA to exclude CAD.  ? ?Current medicines are reviewed at length with the patient today.  The patient does not have concerns regarding medicines. ? ?The following changes have been made:  no change ? ?Labs/ tests ordered today include:  ? ?Orders Placed This Encounter  ?Procedures  ? CT CORONARY MORPH W/CTA COR W/SCORE W/CA W/CM &/OR WO/CM  ? EKG 12-Lead  ? ECHOCARDIOGRAM COMPLETE  ? ? ? ?Disposition:   F/U with me in 6-8 weeks.  ? ? ?Signed, ?Harrell Gave  Angelena Form, MD ?11/29/2021 10:27 AM    ?[facility] ?[place], [place], [place]  [address] ?Phone: [phone]; Fax: [phone]  ? ? ?

## 2021-11-29 ENCOUNTER — Encounter: Payer: Self-pay | Admitting: Cardiovascular Disease

## 2021-11-29 ENCOUNTER — Ambulatory Visit (INDEPENDENT_AMBULATORY_CARE_PROVIDER_SITE_OTHER): Payer: No Typology Code available for payment source | Admitting: Cardiovascular Disease

## 2021-11-29 VITALS — BP 116/72 | HR 61 | Ht 64.0 in | Wt 149.4 lb

## 2021-11-29 DIAGNOSIS — R0609 Other forms of dyspnea: Secondary | ICD-10-CM

## 2021-11-29 NOTE — Patient Instructions (Addendum)
Medication Instructions:  ?No changes ?*If you need a refill on your cardiac medications before your next appointment, please call your pharmacy* ? ? ?Lab Work: ?None ordered ? ?If you have labs (blood work) drawn today and your tests are completely normal, you will receive your results only by: ?MyChart Message (if you have MyChart) OR ?A paper copy in the mail ?If you have any lab test that is abnormal or we need to change your treatment, we will call you to review the results. ? ? ?Testing/Procedures: ?Your physician has requested that you have an echocardiogram. Echocardiography is a painless test that uses sound waves to create images of your heart. It provides your doctor with information about the size and shape of your heart and how well your heart?s chambers and valves are working. This procedure takes approximately one hour. There are no restrictions for this procedure. ? ?Coronary CTA - see instructions below ? ? ?Follow-Up: ?At [facility], you and your health needs are our priority.  As part of our continuing mission to provide you with exceptional heart care, we have created designated Provider Care Teams.  These Care Teams include your primary Cardiologist (physician) and Advanced Practice Providers (APPs -  Physician Assistants and Nurse Practitioners) who all work together to provide you with the care you need, when you need it. ? ? ?Your next appointment:   ?6-8 week(s) ? ?The format for your next appointment:   ?In Person ? ?Provider:   ?Lauree Chandler, MD   ? ? ? ? ?Your cardiac CT will be scheduled at  ?[facility] ?[address] ?[place], [place] [address] ?[phone] ? ?Please arrive at the [facility] Entrance (Entrance C2) of [facility] 30 minutes prior to test start time. ?You can use the FREE valet parking offered at entrance C (encouraged to control the heart rate for the test)  ?Proceed to the [facility] Radiology Department (first floor) to  check-in and test prep. ? ?All radiology patients and guests should use entrance C2 at [facility], accessed from [facility], even though the hospital's physical address listed is [address] ? ? ? ? ?Please follow these instructions carefully (unless otherwise directed): ? ? ?On the Night Before the Test: ?Be sure to Drink plenty of water. ?Do not consume any caffeinated/decaffeinated beverages or chocolate 12 hours prior to your test. ?Do not take any antihistamines 12 hours prior to your test. ? ?On the Day of the Test: ?Drink plenty of water until 1 hour prior to the test. ?Do not eat any food 4 hours prior to the test. ?You may take your regular medications prior to the test.  ?FEMALES- please wear underwire-free bra if available, avoid dresses & tight clothing ? ?     ?After the Test: ?Drink plenty of water. ?After receiving IV contrast, you may experience a mild flushed feeling. This is normal. ?On occasion, you may experience a mild rash up to 24 hours after the test. This is not dangerous. If this occurs, you can take Benadryl 25 mg and increase your fluid intake. ?If you experience trouble breathing, this can be serious. If it is severe call 911 IMMEDIATELY. If it is mild, please call our office. ?If you take any of these medications: Glipizide/Metformin, Avandament, Glucavance, please do not take 48 hours after completing test unless otherwise instructed. ? ?We will call to schedule your test 2-4 weeks out understanding that some insurance companies will need  an authorization prior to the service being performed.  ? ?For non-scheduling related questions, please contact the cardiac imaging nurse navigator should you have any questions/concerns: ?Marchia Bond, Cardiac Imaging Nurse Navigator ?Gordy Clement, Cardiac Imaging Nurse Navigator ?[facility] ?Direct Office Dial: [phone]  ? ?For scheduling needs, including cancellations and  rescheduling, please call [place], [phone]. ?  ?

## 2021-12-12 ENCOUNTER — Telehealth (HOSPITAL_COMMUNITY): Payer: Self-pay | Admitting: Emergency Medicine

## 2021-12-12 NOTE — Telephone Encounter (Signed)
Reaching out to patient to offer assistance regarding upcoming cardiac imaging study; pt verbalizes understanding of appt date/time, parking situation and where to check in, pre-test NPO status and medications ordered, and verified current allergies; name and call back number provided for further questions should they arise ?Marchia Bond RN Navigator Cardiac Imaging ?[facility] ?[phone] office ?[phone] cell ? ?Denies iv issues ?No meds ?Arrival 730 ?

## 2021-12-13 ENCOUNTER — Ambulatory Visit (HOSPITAL_COMMUNITY)
Admission: RE | Admit: 2021-12-13 | Discharge: 2021-12-13 | Disposition: A | Discharge status: Home / Self Care | Payer: No Typology Code available for payment source | Source: Ambulatory Visit | Attending: Cardiovascular Disease | Admitting: Cardiovascular Disease

## 2021-12-13 ENCOUNTER — Encounter (HOSPITAL_COMMUNITY): Payer: Self-pay

## 2021-12-13 DIAGNOSIS — R079 Chest pain, unspecified: Secondary | ICD-10-CM | POA: Diagnosis not present

## 2021-12-13 DIAGNOSIS — R0609 Other forms of dyspnea: Secondary | ICD-10-CM | POA: Diagnosis not present

## 2021-12-13 MED ORDER — NITROGLYCERIN 0.4 MG SL SUBL
0.8000 mg | SUBLINGUAL_TABLET | Freq: Once | SUBLINGUAL | Status: AC
  Administered 2021-12-13: 0.8 mg via SUBLINGUAL

## 2021-12-13 MED ORDER — IOHEXOL 350 MG/ML SOLN
100.0000 mL | Freq: Once | INTRAVENOUS | Status: AC | PRN
  Administered 2021-12-13: 100 mL via INTRAVENOUS

## 2021-12-13 MED ORDER — NITROGLYCERIN 0.4 MG SL SUBL
SUBLINGUAL_TABLET | SUBLINGUAL | Status: AC
Start: 2021-12-13 — End: 2021-12-13
  Filled 2021-12-13: qty 2

## 2021-12-18 ENCOUNTER — Ambulatory Visit (HOSPITAL_COMMUNITY): Payer: No Typology Code available for payment source | Attending: Cardiology

## 2021-12-18 DIAGNOSIS — R0609 Other forms of dyspnea: Secondary | ICD-10-CM | POA: Diagnosis present

## 2021-12-18 LAB — ECHOCARDIOGRAM COMPLETE
Area-P 1/2: 3.06 cm2
S' Lateral: 2.7 cm

## 2021-12-20 ENCOUNTER — Telehealth: Payer: Self-pay | Admitting: Cardiovascular Disease

## 2021-12-20 NOTE — Telephone Encounter (Signed)
Bonnie Blanks, MD  ?12/19/2021  8:33 AM EDT   ?  ?Echo is overall normal. Her heart is strong. No significant valve disease. cdm  ? ?Called patient with results of her Echo. Patient wants to know if her echo and CT is normal, then why did she have an abnormal EKG and what is the next step. Informed patient that a message would be sent to Dr. Angelena Form for advisement. ? ?

## 2021-12-20 NOTE — Telephone Encounter (Signed)
Per Dr. Angelena Form, EKGs are a very non-specific test. Sometimes they appear abnormal but there is no good reason why. Her Coronary CTA showed no evidence of CAD. Zero calcium score which is great. Her heart is strong and her valves are ok. This is good news. She should follow up in primary care for further workup of her dyspnea.  ? ?Called patient with Dr. Angelena Form advisement. Patient verbalized understanding. ?

## 2021-12-20 NOTE — Telephone Encounter (Signed)
Pt returning nurses call regarding test results. Please advise 

## 2022-01-17 ENCOUNTER — Encounter: Payer: Self-pay | Admitting: Cardiovascular Disease

## 2022-01-17 ENCOUNTER — Ambulatory Visit (INDEPENDENT_AMBULATORY_CARE_PROVIDER_SITE_OTHER): Payer: No Typology Code available for payment source | Admitting: Cardiovascular Disease

## 2022-01-17 VITALS — BP 110/72 | HR 44 | Ht 64.0 in | Wt 157.0 lb

## 2022-01-17 DIAGNOSIS — R0609 Other forms of dyspnea: Secondary | ICD-10-CM | POA: Diagnosis not present

## 2022-01-17 NOTE — Progress Notes (Signed)
Chief Complaint  Patient presents with   Follow-up    Chest pain   History of Present Illness: 51 yo female with history of asthma, GERD and migraine headaches who is here today for cardiac follow up. I saw her as a new patient for the evaluation of chest pain and dyspnea on 11/29/21. She went to Urgent Care for an "asthma attack" on 11/12/21 and her EKG was abnormal. She was sent to the [facility] ED  for evaluation. Troponin negative. EKG with  sinus with diffuse T wave flattening. Chest x-ray was normal. Her chest pain was felt to be non-cardiac and outpatient cardiology referral was arranged. Coronary CTA 12/13/21 with calcium score of zero and no evidence of CAD. Echo 12/18/21 with LVEF=55-60%. Normal RV size and function. No valve disease.   She is here today for follow up. The patient denies any chest pain, dyspnea, palpitations, lower extremity edema, orthopnea, PND, dizziness, near syncope or syncope. She is feeling better on hormone replacement therapy.   Primary Care Physician: Patient, No Pcp Per (Inactive)  Past Medical History:  Diagnosis Date   Deviated septum    GERD (gastroesophageal reflux disease)    Migraines    "guaranteed once/month; sporatic from there" (07/17/2016)   Parotid tumor    PONV (postoperative nausea and vomiting)    Seasonal asthma     Past Surgical History:  Procedure Laterality Date   BREAST REDUCTION SURGERY     ENDOMETRIAL ABLATION  2009   HAND SURGERY Left 2004   nerve repair   INGUINAL HERNIA REPAIR Right 1999   PAROTIDECTOMY Left 07/16/2016   WITH FACIAL NERVE DISSECTION AND NIMS MONITORING    PAROTIDECTOMY Left 07/17/2016   Procedure: LEFT PAROTIDECTOMY WITH FACIAL NERVE DISSECTION AND NIMS MONITORING;  Surgeon: Jerrell Belfast, MD;  Location: [facility] OR;  Service: ENT;  Laterality: Left;   TUBAL LIGATION  2001    Current Outpatient Medications  Medication Sig Dispense Refill   albuterol (VENTOLIN HFA) 108 (90 Base) MCG/ACT inhaler Inhale  2 puffs into the lungs every 6 (six) hours as needed for wheezing or shortness of breath. Inhale two puffs every four to six hours as needed for cough or wheeze. 1 Inhaler 0   CLIMARA PRO 0.045-0.015 MG/DAY 1 patch once a week.     cyclobenzaprine (FLEXERIL) 10 MG tablet Take 10 mg by mouth 3 (three) times daily as needed.     MAGNESIUM PO Take 200 mg by mouth daily.     naproxen sodium (ALEVE) 220 MG tablet Take 1 tablet by mouth as needed.     NURTEC 75 MG TBDP Take 1 tablet by mouth as needed.     pantoprazole (PROTONIX) 40 MG tablet Take 1 tablet (40 mg total) by mouth daily. 30 tablet 1   riboflavin (VITAMIN B-2) 100 MG TABS tablet Take 1 tablet by mouth daily.     SUMAtriptan (IMITREX) 100 MG tablet Take 100 mg by mouth 2 (two) times daily as needed for migraine. May repeat in 2 hours if headache persists or recurs.      topiramate (TOPAMAX) 100 MG tablet Take 1 tablet by mouth daily.     No current facility-administered medications for this visit.    Allergies  Allergen Reactions   Epinephrine Nausea Only and Palpitations    Feeling faint   Penicillins Other (See Comments)    Headache & neck pain  Has patient had a PCN reaction causing immediate rash, facial/tongue/throat swelling, SOB or lightheadedness with  hypotension: No  Has patient had a PCN reaction causing severe rash involving mucus membranes or skin necrosis: No Has patient had a PCN reaction that required hospitalization No Has patient had a PCN reaction occurring within the last 10 years: No If all of the above answers are "NO", then may proceed with Cephalosporin use.     Social History   Socioeconomic History   Marital status: Married    Spouse name: Not on file   Number of children: 2   Years of education: Not on file   Highest education level: Not on file  Occupational History   Occupation: Music therapist  Tobacco Use   Smoking status: Never   Smokeless tobacco: Never  Vaping Use   Vaping Use:  Never used  Substance and Sexual Activity   Alcohol use: Not Currently   Drug use: No   Sexual activity: Not on file  Other Topics Concern   Not on file  Social History Narrative   Not on file   Social Determinants of Health   Financial Resource Strain: Not on file  Food Insecurity: Not on file  Transportation Needs: Not on file  Physical Activity: Not on file  Stress: Not on file  Social Connections: Not on file  Intimate Partner Violence: Not on file    Family History  Problem Relation Age of Onset   Depression Father    Depression Sister    Heart attack Sister        Cocaine abuse   Cancer Maternal Grandmother     Review of Systems:  As stated in the HPI and otherwise negative.   BP 110/72   Pulse (!) 44   Ht '5\' 4"'$  (1.626 m)   Wt 157 lb (71.2 kg)   SpO2 99%   BMI 26.95 kg/m   Physical Examination: General: Well developed, well nourished, NAD  HEENT: OP clear, mucus membranes moist  SKIN: warm, dry. No rashes. Neuro: No focal deficits  Musculoskeletal: Muscle strength 5/5 all ext  Psychiatric: Mood and affect normal  Neck: No JVD, no carotid bruits, no thyromegaly, no lymphadenopathy.  Lungs:Clear bilaterally, no wheezes, rhonci, crackles Cardiovascular: Regular rate and rhythm. No murmurs, gallops or rubs. Abdomen:Soft. Bowel sounds present. Non-tender.  Extremities: No lower extremity edema. Pulses are 2 + in the bilateral DP/PT.  EKG:  EKG is not ordered today. The ekg ordered today demonstrates  Recent Labs: 11/12/2021: BUN 13; Creatinine, Ser 1.08; Hemoglobin 13.7; Platelets 324; Potassium 3.3; Sodium 141   Lipid Panel No results found for: CHOL, TRIG, HDL, CHOLHDL, VLDL, LDLCALC, LDLDIRECT   Wt Readings from Last 3 Encounters:  01/17/22 157 lb (71.2 kg)  11/29/21 149 lb 6.4 oz (67.8 kg)  06/15/18 165 lb 9.6 oz (75.1 kg)    Assessment and Plan:   1. Dyspnea on exertion/Chest pain/Abnormal EKG: Normal echo with normal LV and RV function, no  wall motion abnormalities and no valve disease. Coronary CTA with calcium score of zero and no evidence of CAD. No further cardiac workup.   Current medicines are reviewed at length with the patient today.  The patient does not have concerns regarding medicines.  The following changes have been made:  no change  Labs/ tests ordered today include:   No orders of the defined types were placed in this encounter.    Disposition:   F/U with me as needed.    Signed, Lauree Chandler, MD 01/17/2022 8:54 AM    [facility] [phone]  [address] [place], [place]  [address] Phone: [phone]; Fax: [phone]

## 2022-01-17 NOTE — Patient Instructions (Signed)
Medication Instructions:  No changes *If you need a refill on your cardiac medications before your next appointment, please call your pharmacy*   Lab Work: none If you have labs (blood work) drawn today and your tests are completely normal, you will receive your results only by: MyChart Message (if you have MyChart) OR A paper copy in the mail If you have any lab test that is abnormal or we need to change your treatment, we will call you to review the results.   Testing/Procedures: none   Follow-Up: As needed  Important Information About Sugar       

## 2022-05-20 ENCOUNTER — Encounter: Payer: Self-pay | Admitting: Cardiovascular Disease

## 2022-05-20 DIAGNOSIS — R0789 Other chest pain: Secondary | ICD-10-CM

## 2022-05-20 DIAGNOSIS — J45909 Unspecified asthma, uncomplicated: Secondary | ICD-10-CM

## 2022-05-20 DIAGNOSIS — R0609 Other forms of dyspnea: Secondary | ICD-10-CM

## 2022-05-21 NOTE — Telephone Encounter (Signed)
Message from Dr. Angelena Form: Angelena Form, Annita Brod, MD  Rodman Key, RN See MyChart message from her. Can we make a referral to [facility] for her [place] or [place]). Thanks, chris   Referral placed to pulmonary.

## 2022-06-10 ENCOUNTER — Encounter: Payer: Self-pay | Admitting: Pulmonary Disease

## 2022-06-10 ENCOUNTER — Ambulatory Visit (INDEPENDENT_AMBULATORY_CARE_PROVIDER_SITE_OTHER): Payer: No Typology Code available for payment source | Admitting: Pulmonary Disease

## 2022-06-10 VITALS — BP 104/62 | HR 68 | Temp 98.3°F | Ht 64.0 in | Wt 161.2 lb

## 2022-06-10 DIAGNOSIS — Z23 Encounter for immunization: Secondary | ICD-10-CM | POA: Diagnosis not present

## 2022-06-10 DIAGNOSIS — R0602 Shortness of breath: Secondary | ICD-10-CM | POA: Diagnosis not present

## 2022-06-10 DIAGNOSIS — J45909 Unspecified asthma, uncomplicated: Secondary | ICD-10-CM | POA: Diagnosis not present

## 2022-06-10 MED ORDER — FAMOTIDINE 20 MG PO TABS: 20.0000 mg | ORAL_TABLET | Freq: Two times a day (BID) | ORAL | 0 refills | Status: AC

## 2022-06-10 NOTE — Progress Notes (Signed)
Subjective:    Patient ID: Bonnie Lester, female    DOB: Nov 03, 1970, 51 y.o.   MRN: [id number]  HPI   Chief Complaint  Patient presents with   Consult    Pt states she has been having pains for about 8 months. She states it is hard to breath and it burns in chest. Went to Urgent Care and had EKG but nothing wrong with heart. 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 She reports ongoing symptoms of chest heaviness like an elephant sitting on her chest intermittently for about 10 months.  This occasionally wakes her up from sleep and she expresses burning when she breathes and difficulty breathing.  She had an urgent care visit 10/2021 for what she thought was an asthma attack , EKG showed flattened T waves and she was referred to the ER.  She subsequently saw cardiology for an evaluation and had a normal echo and calcium score on CT coronaries  I have reviewed cardiology consultation ED visit.  Since then she stopped taking Topamax.  She also stopped taking Zyrtec which she has been on for many years .  She also wonders if symptoms could be related to long COVID  PMH -perennial allergies, "asthma" COVID infection 09/2019, 02/2019  Environment -reports stressors at work, recently changed her job, has a dog at home for 12 years   Significant tests/ events reviewed  Arlyce Harman 06/2022 ratio 84, FEV1 89%, FVC 83%, no airway obstruction 05/2016 spirometry -ratio 92, FEV1 88%, FVC 78%  06/2016 spirometry ratio 88, FEV1 85%, FVC 80%  Coronary CTA 12/13/21 with calcium score of zero and no evidence of CAD. Echo 12/18/21 with LVEF=55-60%. Normal RV size and function. No valve disease.   Past Medical History:  Diagnosis Date   Deviated septum    GERD (gastroesophageal reflux disease)    Migraines    "guaranteed once/month; sporatic from there" (07/17/2016)   Parotid tumor    PONV (postoperative nausea and  vomiting)    Seasonal asthma    Past Surgical History:  Procedure Laterality Date   BREAST REDUCTION SURGERY     ENDOMETRIAL ABLATION  2009   HAND SURGERY Left 2004   nerve repair   INGUINAL HERNIA REPAIR Right 1999   PAROTIDECTOMY Left 07/16/2016   WITH FACIAL NERVE DISSECTION AND NIMS MONITORING    PAROTIDECTOMY Left 07/17/2016   Procedure: LEFT PAROTIDECTOMY WITH FACIAL NERVE DISSECTION AND NIMS MONITORING;  Surgeon: Jerrell Belfast, MD;  Location: [facility];  Service: ENT;  Laterality: Left;   TUBAL LIGATION  2001    Allergies  Allergen Reactions   Epinephrine Nausea Only and Palpitations    Feeling faint   Penicillins Other (See Comments)    Headache & neck pain  Has patient had a PCN reaction causing immediate rash, facial/tongue/throat swelling, SOB or lightheadedness with hypotension: No  Has patient had a PCN reaction causing severe rash involving mucus membranes or skin necrosis: No Has patient had a PCN reaction that required hospitalization No Has patient had a PCN reaction occurring within the last 10 years: No If all of the above answers are "NO", then may proceed with Cephalosporin use.     Social History   Socioeconomic History   Marital status: Married    Spouse name: Not on file   Number of children: 2   Years of education: Not on file   Highest education level: Not  on file  Occupational History   Occupation: Music therapist  Tobacco Use   Smoking status: Never    Passive exposure: Past   Smokeless tobacco: Never  Vaping Use   Vaping Use: Never used  Substance and Sexual Activity   Alcohol use: Not Currently   Drug use: No   Sexual activity: Not on file  Other Topics Concern   Not on file  Social History Narrative   Not on file   Social Determinants of Health   Financial Resource Strain: Not on file  Food Insecurity: Not on file  Transportation Needs: Not on file  Physical Activity: Not on file  Stress: Not on file  Social Connections:  Not on file  Intimate Partner Violence: Not on file    Family History  Problem Relation Age of Onset   Depression Father    Depression Sister    Heart attack Sister        Cocaine abuse   Cancer Maternal Grandmother      Review of Systems   Constitutional: negative for anorexia, fevers and sweats  Eyes: negative for irritation, redness and visual disturbance  Ears, nose, mouth, throat, and face: negative for earaches, epistaxis, nasal congestion and sore throat  Respiratory: negative for cough,  sputum and wheezing  Cardiovascular: negative for lower extremity edema, orthopnea, palpitations and syncope  Gastrointestinal: negative for abdominal pain, constipation, diarrhea, melena, nausea and vomiting  Genitourinary:negative for dysuria, frequency and hematuria  Hematologic/lymphatic: negative for bleeding, easy bruising and lymphadenopathy  Musculoskeletal:negative for arthralgias, muscle weakness and stiff joints  Neurological: negative for coordination problems, gait problems, headaches and weakness  Endocrine: negative for diabetic symptoms including polydipsia, polyuria and weight loss     Objective:   Physical Exam  Gen. Pleasant, well-nourished, in no distress, normal affect ENT - no pallor,icterus, no post nasal drip Neck: No JVD, no thyromegaly, no carotid bruits Lungs: no use of accessory muscles, no dullness to percussion, clear without rales or rhonchi  Cardiovascular: Rhythm regular, heart sounds  normal, no murmurs or gallops, no peripheral edema Abdomen: soft and non-tender, no hepatosplenomegaly, BS normal. Musculoskeletal: No deformities, no cyanosis or clubbing Neuro:  alert, non focal       Assessment & Plan:   Dyspnea/chest heaviness -some Tums are not typical for asthma.  Spirometry does not show any evidence of airway obstruction. Differential includes reflux, anxiety related to stressors, less likely OSA.  She does report predominantly nocturnal  symptoms so we will investigate with a home sleep test.  We will undertake a trial of Pepcid OTC for 4 weeks for reflux.  She will report back for improvement.  I have reviewed cardiology evaluation which was negative.

## 2022-06-10 NOTE — Patient Instructions (Addendum)
  X spirometry  X Pepcid 20 mg OTC at pm x 4 weeks    X Home sleep test

## 2022-06-24 ENCOUNTER — Ambulatory Visit
Admission: EM | Admit: 2022-06-24 | Discharge: 2022-06-24 | Disposition: A | Discharge status: Home / Self Care | Payer: No Typology Code available for payment source

## 2022-06-24 DIAGNOSIS — L02511 Cutaneous abscess of right hand: Secondary | ICD-10-CM | POA: Diagnosis not present

## 2022-06-24 MED ORDER — SULFAMETHOXAZOLE-TRIMETHOPRIM 800-160 MG PO TABS
1.0000 | ORAL_TABLET | Freq: Two times a day (BID) | ORAL | 0 refills | Status: AC
Start: 2022-06-24 — End: 2022-07-01

## 2022-06-24 NOTE — ED Provider Notes (Signed)
[facility]    CSN: [id number] Arrival date & time: 06/24/22  1306      History   Chief Complaint Chief Complaint  Patient presents with   possible finger infection    HPI Bonnie Lester is a 51 y.o. female.   HPI  Presents to UC with complaint of possible finger infection.  Patient reports cutting her right index finger last week while away camping.  She has developed a white lesion over the site of the cut that is very painful.  She is concerned about infection.  Treating self with Neosporin and hydrogen peroxide.  Past Medical History:  Diagnosis Date   Deviated septum    GERD (gastroesophageal reflux disease)    Migraines    "guaranteed once/month; sporatic from there" (07/17/2016)   Parotid tumor    PONV (postoperative nausea and vomiting)    Seasonal asthma     Patient Active Problem List   Diagnosis Date Noted   Parotid mass 07/17/2016    Past Surgical History:  Procedure Laterality Date   BREAST REDUCTION SURGERY     ENDOMETRIAL ABLATION  2009   HAND SURGERY Left 2004   nerve repair   INGUINAL HERNIA REPAIR Right 1999   PAROTIDECTOMY Left 07/16/2016   WITH FACIAL NERVE DISSECTION AND NIMS MONITORING    PAROTIDECTOMY Left 07/17/2016   Procedure: LEFT PAROTIDECTOMY WITH FACIAL NERVE DISSECTION AND NIMS MONITORING;  Surgeon: Jerrell Belfast, MD;  Location: [facility];  Service: ENT;  Laterality: Left;   TUBAL LIGATION  2001    OB History   No obstetric history on file.      Home Medications    Prior to Admission medications   Medication Sig Start Date End Date Taking? Authorizing Provider  albuterol (VENTOLIN HFA) 108 (90 Base) MCG/ACT inhaler Inhale 2 puffs into the lungs every 6 (six) hours as needed for wheezing or shortness of breath. Inhale two puffs every four to six hours as needed for cough or wheeze. 02/17/19   Lamptey, Myrene Galas, MD  cyclobenzaprine (FLEXERIL) 10 MG tablet Take 10 mg by mouth 3 (three) times daily as needed. 10/02/21    [provider]  estradiol-norethindrone (ACTIVELLA) 1-0.5 MG tablet Take 1 tablet by mouth daily.    [provider]  famotidine (PEPCID) 20 MG tablet Take 1 tablet (20 mg total) by mouth 2 (two) times daily. 06/10/22   Rigoberto Noel, MD  MAGNESIUM PO Take 200 mg by mouth daily.    [provider]  naproxen sodium (ALEVE) 220 MG tablet Take 1 tablet by mouth as needed.    [provider]  NURTEC 75 MG TBDP Take 1 tablet by mouth as needed. 11/26/21   [provider]  riboflavin (VITAMIN B-2) 100 MG TABS tablet Take 1 tablet by mouth daily.    [provider]  SUMAtriptan (IMITREX) 100 MG tablet Take 100 mg by mouth 2 (two) times daily as needed for migraine. May repeat in 2 hours if headache persists or recurs.     [provider]    Family History Family History  Problem Relation Age of Onset   Depression Father    Depression Sister    Heart attack Sister        Cocaine abuse   Cancer Maternal Grandmother     Social History Social History   Tobacco Use   Smoking status: Never    Passive exposure: Past   Smokeless tobacco: Never  Vaping Use  Vaping Use: Never used  Substance Use Topics   Alcohol use: Not Currently   Drug use: No     Allergies   Epinephrine and Penicillins   Review of Systems Review of Systems   Physical Exam Triage Vital Signs ED Triage Vitals  Enc Vitals Group     BP      Pulse      Resp      Temp      Temp src      SpO2      Weight      Height      Head Circumference      Peak Flow      Pain Score      Pain Loc      Pain Edu?      Excl. in [place]?    No data found.  Updated Vital Signs There were no vitals taken for this visit.  Visual Acuity Right Eye Distance:   Left Eye Distance:   Bilateral Distance:    Right Eye Near:   Left Eye Near:    Bilateral Near:     Physical Exam Vitals reviewed.  Constitutional:      Appearance: Normal appearance.   Musculoskeletal:       Hands:  Skin:    General: Skin is warm and dry.  Neurological:     General: No focal deficit present.     Mental Status: She is alert and oriented to person, place, and time.  Psychiatric:        Mood and Affect: Mood normal.        Behavior: Behavior normal.      UC Treatments / Results  Labs (all labs ordered are listed, but only abnormal results are displayed) Labs Reviewed - No data to display  EKG   Radiology No results found.  Procedures Incision and Drainage  Date/Time: 06/24/2022 2:27 PM  Performed by: Rose Phi, FNP Authorized by: Rose Phi, FNP   Consent:    Consent obtained:  Verbal   Consent given by:  Patient   Risks, benefits, and alternatives were discussed: yes     Risks discussed:  Bleeding, incomplete drainage and pain   Alternatives discussed:  No treatment Universal protocol:    Procedure explained and questions answered to patient or proxy's satisfaction: yes     Relevant documents present and verified: yes     Test results available : no     Imaging studies available: no     Required blood products, implants, devices, and special equipment available: no     Immediately prior to procedure, a time out was called: yes     Patient identity confirmed:  Verbally with patient Location:    Type:  Abscess   Size:  0.5 cm   Location:  Upper extremity   Upper extremity location:  Finger   Finger location:  R index finger Pre-procedure details:    Skin preparation:  Povidone-iodine Sedation:    Sedation type:  None Anesthesia:    Anesthesia method:  Local infiltration   Local anesthetic:  Lidocaine 1% w/o epi Procedure type:    Complexity:  Simple Procedure details:    Ultrasound guidance: no     Needle aspiration: no     Incision types:  Stab incision   Incision depth:  Dermal   Wound management:  Probed and deloculated   Drainage:  Purulent   Drainage amount:  Moderate   Wound treatment:   Wound left  open   Packing materials:  None Post-procedure details:    Procedure completion:  Tolerated with difficulty Comments:     Patient felt nausea upon administration of lidocaine intradermal.  Reviewed post procedure instructions with patient.  (including critical care time)  Medications Ordered in UC Medications - No data to display  Initial Impression / Assessment and Plan / UC Course  I have reviewed the triage vital signs and the nursing notes.  Pertinent labs & imaging results that were available during my care of the patient were reviewed by me and considered in my medical decision making (see chart for details).   See procedure note.   Final Clinical Impressions(s) / UC Diagnoses   Final diagnoses:  None   Discharge Instructions   None    ED Prescriptions   None    PDMP not reviewed this encounter.   Rose Phi, [place] 06/24/22 1437

## 2022-06-24 NOTE — Discharge Instructions (Addendum)
Dressing daily or when soiled or wet.  Soak daily in warm water with dissolved Epsom salts.  Apply antibiotic ointment or sterile petroleum jelly to the incision and cover with Band-Aid.  Follow up here or with your primary care provider if symptoms do not resolve.

## 2022-06-24 NOTE — ED Triage Notes (Signed)
Pt. Presents to UC w/ c/o possible finger infection. Pt. Stated she cut her right index finger last week while camping and has had a white patch appear over the cut. Pt. Endorses concern for infection. Pt. Has been treating herself w/ neosporin and hydrogen Peroxide.

## 2022-07-09 ENCOUNTER — Encounter: Payer: Self-pay | Admitting: Neurology

## 2022-07-09 ENCOUNTER — Telehealth: Payer: Self-pay | Admitting: Neurology

## 2022-07-09 ENCOUNTER — Ambulatory Visit (INDEPENDENT_AMBULATORY_CARE_PROVIDER_SITE_OTHER): Payer: No Typology Code available for payment source | Admitting: Neurology

## 2022-07-09 VITALS — BP 103/63 | HR 65 | Ht 64.0 in | Wt 163.0 lb

## 2022-07-09 DIAGNOSIS — R442 Other hallucinations: Secondary | ICD-10-CM

## 2022-07-09 DIAGNOSIS — G43711 Chronic migraine without aura, intractable, with status migrainosus: Secondary | ICD-10-CM

## 2022-07-09 DIAGNOSIS — R430 Anosmia: Secondary | ICD-10-CM | POA: Diagnosis not present

## 2022-07-09 DIAGNOSIS — R51 Headache with orthostatic component, not elsewhere classified: Secondary | ICD-10-CM | POA: Diagnosis not present

## 2022-07-09 DIAGNOSIS — R519 Headache, unspecified: Secondary | ICD-10-CM | POA: Diagnosis not present

## 2022-07-09 DIAGNOSIS — G43101 Migraine with aura, not intractable, with status migrainosus: Secondary | ICD-10-CM

## 2022-07-09 DIAGNOSIS — H539 Unspecified visual disturbance: Secondary | ICD-10-CM

## 2022-07-09 DIAGNOSIS — G44021 Chronic cluster headache, intractable: Secondary | ICD-10-CM

## 2022-07-09 MED ORDER — ZTLIDO 1.8 % EX PTCH: 2.0000 | MEDICATED_PATCH | Freq: Every day | CUTANEOUS | 0 refills | Status: AC

## 2022-07-09 MED ORDER — ESCITALOPRAM OXALATE 10 MG PO TABS: 10.0000 mg | ORAL_TABLET | Freq: Every day | ORAL | 6 refills | Status: DC

## 2022-07-09 MED ORDER — ALPRAZOLAM 0.25 MG PO TABS: ORAL_TABLET | ORAL | 0 refills | Status: DC

## 2022-07-09 MED ORDER — NURTEC 75 MG PO TBDP: 75.0000 mg | ORAL_TABLET | Freq: Every day | ORAL | 0 refills | Status: DC | PRN

## 2022-07-09 NOTE — Progress Notes (Signed)
errpr

## 2022-07-09 NOTE — Telephone Encounter (Signed)
[employer] sent to [facility] they obtain auth [id number] 

## 2022-07-09 NOTE — Telephone Encounter (Signed)
Dr. Jaynee Eagles would like for this patient to get started on Botox for migraines please.  Once approved, ok per Dr. Jaynee Eagles to add the end of her schedule one day in a 4pm spot so she doesn't have to wait a long time to start.

## 2022-07-09 NOTE — Progress Notes (Signed)
[facility]    Provider:  Dr Jaynee Eagles Requesting Provider: Marylynn Pearson, MD Primary Care Provider:  Patient, No Pcp Per  CC: migraines  HPI:  Bonnie Lester is a 51 y.o. female here as requested by Marylynn Pearson, MD for migraines. She has a PMHx of parotid mass, migraines, menopause, I reviewed Dr. Orvan Seen notes, she contonues to have migraines despite treating with imitrex, nurtec, ubrelvy. And she reports an aura. She feels hopeless. She has acid reflux, no snoring, wake up refreshed, no napping during the day. Start in the front unilaterally, pulsating/pounding/throbbing, photo/phonophobia, smells can trigger and "phantom smells" , nausea, vomiting, 4-72 hours and are moderate to severe she can be in bed for 3-4 days. No medication overuse. Over last 6 months, daily headaches and 8 severe migraines days a month and > 12 moderate to severe migraine days a month. Aura is vert rare all migraines are without aura. Nurtec every other day has helped but that causes constipation as well. Roselyn Meier made her worse. Imitrex works. She breaks it in half with an alleve.   Reviewed notes, labs and imaging from outside physicians, which showed:   From a thorough review of records, medications tried that can be used in migraine or headache management include:Current and past medications: ANALGESICS: Oxycodone, Excedrin, Tylenol ANTI-MIGRAINE: Imitrex pill and injection, Treximet, Zomig, Ubrelvy, Nurtec HEART/BP: Beta blockers avoided due to low bp DECONGESTANT/ANTIHISTAMINE: Allegra, Benadryl, Flonase, Sudafed,  Zyrtec ANTI-NAUSEANT: Phenergan, Zofran NSAIDS: Ibuprofen, naproxen MUSCLE RELAXANTS: Flexeril ANTI-CONVULSANTS: Topamax (Shortness of breath, concentration issues) STEROIDS: SLEEPING PILLS/TRANQUILIZERS: ANTI-DEPRESSANTS: Zoloft, amitriptyline HERBAL: Co-Q10, magnesium, B2 FIBROMYALGIA:  HORMONAL: OTHER: Emgality-severe constipation, aimovig is contraindicated due to  severe constipation.  PROCEDURES FOR HEADACHES: Botox-she did not like the injections   HPI This is a chronic problem. Episode onset: childhood. The problem occurs intermittently. The problem has been waxing and waning. The pain is located in the frontal and left unilateral region. The quality of the pain is described as dull, sharp, stabbing and throbbing. The pain is severe. Associated symptoms include blurred vision, eye pain, insomnia, nausea, phonophobia, photophobia, sinus pressure and a visual change. The symptoms are aggravated by activity, alcohol, bright light, caffeine withdrawal, fatigue, food, menstrual cycle, MSG, noise and weather changes.  History of rare aura in the past.  Most are visual with lasting about an hour. Headaches last 2-3 days.  Recently more auras which include losing half of her vision. Headaches always follows.  She has a family history, grandmother and mother, of migraines.   Seen today in recheck of headaches. At last visit, she received her first Botox injection. This has been over a year ago. She felt that Botox just was not for he and never returned for a second injection cycle. In November 2022, she restarted Topamax and titrated it up to 100 mg nightly. She has not suffered from the same symptoms she had previously. Her headaches had been doing well up until this past month when she reportedly has had some sinus issues.   She takes Imitrex as needed for severe headaches. She had been trialed on Nurtec and felt that it helped but had been unable to get it. It now looks like it is on her insurance formulary and we will reorder it. She will continue on cyclobenzaprine as needed for milder headaches.    She exercises every day and has taken Zyrtec at night which helps her sleep. She had a breast reduction which helped with some of her neck pain.  Current and past medications: ANALGESICS: Oxycodone, Excedrin, Tylenol ANTI-MIGRAINE: Imitrex pill and injection,  Treximet, Zomig, Ubrelvy, Nurtec HEART/BP: Beta blockers avoided due to low bp DECONGESTANT/ANTIHISTAMINE: Allegra, Benadryl, Flonase, Sudafed,  Zyrtec ANTI-NAUSEANT: Phenergan, Zofran NSAIDS: Ibuprofen, naproxen MUSCLE RELAXANTS: Flexeril ANTI-CONVULSANTS: Topamax (Shortness of breath, concentration issues) STEROIDS: SLEEPING PILLS/TRANQUILIZERS: ANTI-DEPRESSANTS: Zoloft, amitriptyline HERBAL: Co-Q10, magnesium, B2 FIBROMYALGIA:  HORMONAL: OTHER: Emgality-severe constipation PROCEDURES FOR HEADACHES: Botox-she did not like the injections   Review of Systems: Patient complains of symptoms per HPI as well as the following symptoms migraine. Pertinent negatives and positives per HPI. All others negative.   Social History   Socioeconomic History   Marital status: Married    Spouse name: Not on file   Number of children: 2   Years of education: Not on file   Highest education level: Not on file  Occupational History   Occupation: Music therapist  Tobacco Use   Smoking status: Never    Passive exposure: Past   Smokeless tobacco: Never  Vaping Use   Vaping Use: Never used  Substance and Sexual Activity   Alcohol use: Not Currently    Comment: occ   Drug use: No   Sexual activity: Not on file  Other Topics Concern   Not on file  Social History Narrative   Not on file   Social Determinants of Health   Financial Resource Strain: Not on file  Food Insecurity: Not on file  Transportation Needs: Not on file  Physical Activity: Not on file  Stress: Not on file  Social Connections: Not on file  Intimate Partner Violence: Not on file    Family History  Problem Relation Age of Onset   Migraines Mother    Depression Father    Depression Sister    Heart attack Sister        Cocaine abuse   Cancer Maternal Grandmother     Past Medical History:  Diagnosis Date   Deviated septum    GERD (gastroesophageal reflux disease)    Migraines    "guaranteed once/month;  sporatic from there" (07/17/2016)   Parotid tumor    PONV (postoperative nausea and vomiting)    Seasonal asthma     Patient Active Problem List   Diagnosis Date Noted   Parotid mass 07/17/2016    Past Surgical History:  Procedure Laterality Date   BREAST REDUCTION SURGERY     ENDOMETRIAL ABLATION  2009   HAND SURGERY Left 2004   nerve repair   INGUINAL HERNIA REPAIR Right 1999   PAROTIDECTOMY Left 07/16/2016   WITH FACIAL NERVE DISSECTION AND NIMS MONITORING    PAROTIDECTOMY Left 07/17/2016   Procedure: LEFT PAROTIDECTOMY WITH FACIAL NERVE DISSECTION AND NIMS MONITORING;  Surgeon: Jerrell Belfast, MD;  Location: [facility];  Service: ENT;  Laterality: Left;   TUBAL LIGATION  2001    Current Outpatient Medications  Medication Sig Dispense Refill   albuterol (VENTOLIN HFA) 108 (90 Base) MCG/ACT inhaler Inhale 2 puffs into the lungs every 6 (six) hours as needed for wheezing or shortness of breath. Inhale two puffs every four to six hours as needed for cough or wheeze. 1 Inhaler 0   ALPRAZolam (XANAX) 0.25 MG tablet Take 1-2 tabs (0.'25mg'$ -0.'50mg'$ ) 30-60 minutes before procedure. May repeat if needed.Do not drive. Otherwise can take 1-2 doses up to 4 times a day sparingly. 30 tablet 0   Cetirizine HCl (ZYRTEC ALLERGY) 10 MG CAPS Take by oral route as needed.     cyclobenzaprine (FLEXERIL) 10 MG tablet  Take 10 mg by mouth 3 (three) times daily as needed.     escitalopram (LEXAPRO) 10 MG tablet Take 1 tablet (10 mg total) by mouth daily. 90 tablet 6   estradiol-norethindrone (ACTIVELLA) 1-0.5 MG tablet Take 1 tablet by mouth daily.     famotidine (PEPCID) 20 MG tablet Take 1 tablet (20 mg total) by mouth 2 (two) times daily. 30 tablet 0   Lidocaine (ZTLIDO) 1.8 % PTCH Apply 2 patches topically daily. 8 patch 0   MAGNESIUM PO Take 200 mg by mouth daily.     naproxen sodium (ALEVE) 220 MG tablet Take 1 tablet by mouth as needed.     NURTEC 75 MG TBDP Take 1 tablet by mouth as needed.      riboflavin (VITAMIN B-2) 100 MG TABS tablet Take 1 tablet by mouth daily.     Rimegepant Sulfate (NURTEC) 75 MG TBDP Take 75 mg by mouth daily as needed. For migraines. Take as close to onset of migraine as possible. One daily maximum. 6 tablet 0   SUMAtriptan (IMITREX) 100 MG tablet Take 100 mg by mouth 2 (two) times daily as needed for migraine. May repeat in 2 hours if headache persists or recurs.      No current facility-administered medications for this visit.    Allergies as of 07/09/2022 - Review Complete 07/09/2022  Allergen Reaction Noted   Epinephrine Nausea Only and Palpitations 04/13/2021   Penicillins Other (See Comments) 07/09/2016    Vitals: BP 103/63   Pulse 65   Ht '5\' 4"'$  (1.626 m)   Wt 163 lb (73.9 kg)   BMI 27.98 kg/m  Last Weight:  Wt Readings from Last 1 Encounters:  07/09/22 163 lb (73.9 kg)   Last Height:   Ht Readings from Last 1 Encounters:  07/09/22 '5\' 4"'$  (1.626 m)     Physical exam: Exam: Gen: NAD, conversant, well nourised, well groomed                     CV: RRR, no MRG. No Carotid Bruits. No peripheral edema, warm, nontender Eyes: Conjunctivae clear without exudates or hemorrhage  Neuro: Detailed Neurologic Exam  Speech:    Speech is normal; fluent and spontaneous with normal comprehension.  Cognition:    The patient is oriented to person, place, and time;     recent and remote memory intact;     language fluent;     normal attention, concentration,     fund of knowledge Cranial Nerves:    The pupils are equal, round, and reactive to light. The fundi are normal and spontaneous venous pulsations are present. Visual fields are full to finger confrontation. Extraocular movements are intact. Trigeminal sensation is intact and the muscles of mastication are normal. The face is symmetric. The palate elevates in the midline. Hearing intact. Voice is normal. Shoulder shrug is normal. The tongue has normal motion without fasciculations.    Coordination:    Normal finger to nose and heel to shin. Normal rapid alternating movements.   Gait:    Heel-toe and tandem gait are normal.   Motor Observation:    No asymmetry, no atrophy, and no involuntary movements noted. Tone:    Normal muscle tone.    Posture:    Posture is normal. normal erect    Strength:    Strength is V/V in the upper and lower limbs.      Sensation: intact to LT     Reflex Exam:  DTR's:  Deep tendon reflexes in the upper and lower extremities are normal bilaterally.   Toes:    The toes are downgoing bilaterally.   Clonus:    Clonus is absent.    Assessment/Plan:  Patient with chronic migraines, failed plethora of meds, start botox (when she had botox got > 50% relief but hated the needles we will try again and be more gentle)  Be gentle on botox had bad experience Will give xanax prn prior to botox Lidocaine 4% prior to office botox on face and anywhere you want for botox MRI of the brain - will call Ztildo for traps Lexapro for depression  Orders Placed This Encounter  Procedures   MR BRAIN W WO CONTRAST   CBC with Differential/Platelets   Comprehensive metabolic panel   TSH Rfx on Abnormal to Free T4   Meds ordered this encounter  Medications   ALPRAZolam (XANAX) 0.25 MG tablet    Sig: Take 1-2 tabs (0.'25mg'$ -0.'50mg'$ ) 30-60 minutes before procedure. May repeat if needed.Do not drive. Otherwise can take 1-2 doses up to 4 times a day sparingly.    Dispense:  30 tablet    Refill:  0   Rimegepant Sulfate (NURTEC) 75 MG TBDP    Sig: Take 75 mg by mouth daily as needed. For migraines. Take as close to onset of migraine as possible. One daily maximum.    Dispense:  6 tablet    Refill:  0   Lidocaine (ZTLIDO) 1.8 % PTCH    Sig: Apply 2 patches topically daily.    Dispense:  8 patch    Refill:  0   escitalopram (LEXAPRO) 10 MG tablet    Sig: Take 1 tablet (10 mg total) by mouth daily.    Dispense:  90 tablet    Refill:  6    Discussed: To prevent or relieve headaches, try the following: Cool Compress. Lie down and place a cool compress on your head.  Avoid headache triggers. If certain foods or odors seem to have triggered your migraines in the past, avoid them. A headache diary might help you identify triggers.  Include physical activity in your daily routine. Try a daily walk or other moderate aerobic exercise.  Manage stress. Find healthy ways to cope with the stressors, such as delegating tasks on your to-do list.  Practice relaxation techniques. Try deep breathing, yoga, massage and visualization.  Eat regularly. Eating regularly scheduled meals and maintaining a healthy diet might help prevent headaches. Also, drink plenty of fluids.  Follow a regular sleep schedule. Sleep deprivation might contribute to headaches Consider biofeedback. With this mind-body technique, you learn to control certain bodily functions -- such as muscle tension, heart rate and blood pressure -- to prevent headaches or reduce headache pain.    Proceed to emergency room if you experience new or worsening symptoms or symptoms do not resolve, if you have new neurologic symptoms or if headache is severe, or for any concerning symptom.   Provided education and documentation from American headache Society toolbox including articles on: chronic migraine medication overuse headache, chronic migraines, prevention of migraines, behavioral and other nonpharmacologic treatments for headache.   Cc: Marylynn Pearson, MD,  Patient, No Pcp Per  Sarina Ill, MD  [facility] [address] [place],  [address]  Phone [phone] Fax [phone]  I spent 60 minutes of face-to-face and non-face-to-face time with patient on the  1. Chronic migraine without aura, with intractable migraine, so stated, with status migrainosus  2. Anosmia   3. Morning headache   4. Worsening headaches   5. Positional  headache   6. Intractable chronic cluster headache   7. Vision changes   8. Olfactory hallucination   9. Migraine with aura and with status migrainosus, not intractable    diagnosis.  This included previsit chart review, lab review, study review, order entry, electronic health record documentation, patient education on the different diagnostic and therapeutic options, counseling and coordination of care, risks and benefits of management, compliance, or risk factor reduction

## 2022-07-09 NOTE — Patient Instructions (Addendum)
Will give xanax prn prior to botox Lidocaine 4% prior to office botox on face and anywhere you want for botox MRI of the brain - will call [facility] for traps Discussed Ajovy   Meds ordered this encounter  Medications   ALPRAZolam (XANAX) 0.25 MG tablet    Sig: Take 1-2 tabs (0.'25mg'$ -0.'50mg'$ ) 30-60 minutes before procedure. May repeat if needed.Do not drive. Otherwise can take 1-2 doses up to 4 times a day sparingly.    Dispense:  30 tablet    Refill:  0   Rimegepant Sulfate (NURTEC) 75 MG TBDP    Sig: Take 75 mg by mouth daily as needed. For migraines. Take as close to onset of migraine as possible. One daily maximum.    Dispense:  6 tablet    Refill:  0   Lidocaine (ZTLIDO) 1.8 % PTCH    Sig: Apply 2 patches topically daily.    Dispense:  8 patch    Refill:  0   Orders Placed This Encounter  Procedures   MR BRAIN W WO CONTRAST     Alprazolam Tablets What is this medication? ALPRAZOLAM (al PRAY zoe lam) treats anxiety. It works by Child psychotherapist system calm down. It belongs to a group of medications called benzodiazepines. This medicine may be used for other purposes; ask your health care provider or pharmacist if you have questions. COMMON BRAND NAME(S): Xanax What should I tell my care team before I take this medication? They need to know if you have any of these conditions: Kidney disease Liver disease Lung disease, such as asthma or breathing problems Mental health condition Seizures Substance use disorder Suicidal thoughts, plans, or attempt by you or a family member An unusual or allergic reaction to alprazolam, other medications, foods, dyes, or preservatives Pregnant or trying to get pregnant Breastfeeding How should I use this medication? Take this medication by mouth. Take it as directed on the prescription label. Do not take it more often than directed. Keep taking it unless your care team tells you to stop. A special MedGuide will be given to you by the  pharmacist with each prescription and refill. Be sure to read this information carefully each time. Talk to your care team about the use of this medication in children. Special care may be needed. People 65 years and older may have a stronger reaction and need a smaller dose. Overdosage: If you think you have taken too much of this medicine contact a poison control center or emergency room at once. NOTE: This medicine is only for you. Do not share this medicine with others. What if I miss a dose? If you miss a dose, take it as soon as you can. If it is almost time for your next dose, take only that dose. Do not take double or extra doses. What may interact with this medication? Do not take this medication with any of the following: Adagrasib Certain antivirals for HIV or hepatitis Certain medications for fungal infections, such as ketoconazole, itraconazole, or posaconazole Clarithromycin Grapefruit juice Opioid medications for cough Sodium oxybate This medication may also interact with the following: Alcohol Antihistamines for allergy, cough and cold Certain medications for anxiety or sleep Certain medications for depression, such as amitriptyline, fluoxetine, fluvoxamine, nefazodone, sertraline Certain medications for seizures, such as carbamazepine, phenobarbital, phenytoin, primidone Cimetidine Digoxin Erythromycin Estrogen or progestin hormones General anesthetics, such as halothane, isoflurane, methoxyflurane, propofol Medications that relax muscles Opioid medications for pain Phenothiazines, such as chlorpromazine, mesoridazine, prochlorperazine, thioridazine This  list may not describe all possible interactions. Give your health care provider a list of all the medicines, herbs, non-prescription drugs, or dietary supplements you use. Also tell them if you smoke, drink alcohol, or use illegal drugs. Some items may interact with your medicine. What should I watch for while using  this medication? Visit your care team for regular checks on your progress. Tell your care team if your symptoms do not start to get better or if they get worse. Do not stop taking except on your care team's advice. You may develop a severe reaction. Your care team will tell you how much medication to take. Long term use of this medication may cause your brain and body to depend on it. This can happen even when used as directed by your care team. You and your care team will work together to determine how long you will need to take this medication. This medication may affect your coordination, reaction time, or judgment. Do not drive or operate machinery until you know how this medication affects you. Sit up or stand slowly to reduce the risk of dizzy or fainting spells. Drinking alcohol with this medication can increase the risk of these side effects. If you are taking another medication that also causes drowsiness, you may have more side effects. Give your care team a list of all medications you use. Your care team will tell you how much medication to take. Do not take more medication than directed. Get emergency help right away if you have problems breathing or unusual sleepiness. If you or your family notice any changes in your behavior, such as new or worsening depression, thoughts of harming yourself, anxiety, other unusual or disturbing thoughts, or memory loss, call your care team right away. Talk to your care team if you wish to become pregnant or think you might be pregnant. This medication can cause serious birth defects. Talk to your care team before breastfeeding. Changes to your treatment plan may be needed. What side effects may I notice from receiving this medication? Side effects that you should report to your care team as soon as possible: Allergic reactions--skin rash, itching, hives, swelling of the face, lips, tongue, or throat CNS depression--slow or shallow breathing, shortness of  breath, feeling faint, dizziness, confusion, trouble staying awake Thoughts of suicide or self-harm, worsening mood, feelings of depression Side effects that usually do not require medical attention (report to your care team if they continue or are bothersome): Change in sex drive or performance Dizziness Drowsiness Nausea This list may not describe all possible side effects. Call your doctor for medical advice about side effects. You may report side effects to FDA at 1-800-FDA-1088. Where should I keep my medication? Keep out of the reach of children and pets. This medication can be abused. Keep it in a safe place to protect it from theft. Do not share it with anyone. It is only for you. Selling or giving away this medication is dangerous and against the law. Store at room temperature between 20 and 25 degrees C (68 and 77 degrees F). Get rid of any unused medication after the expiration date. This medication may cause harm and death if it is taken by other adults, children, or pets. It is important to get rid of the medication as soon as you no longer need it, or it is expired. You can do this in two ways: Take the medication to a medication take-back program. Check with your pharmacy or law enforcement to  find a location. If you cannot return the medication, check the label or package insert to see if the medication should be thrown out in the garbage or flushed down the toilet. If you are not sure, ask your care team. If it is safe to put it in the trash, take the medication out of the container. Mix the medication with cat litter, dirt, coffee grounds, or other unwanted substance. Seal the mixture in a bag or container. Put it in the trash. NOTE: This sheet is a summary. It may not cover all possible information. If you have questions about this medicine, talk to your doctor, pharmacist, or health care provider.  2023 Elsevier/Gold Standard (2007-10-10 00:00:00)    Lidocaine Patches What is  this medication? LIDOCAINE (LYE doe kane) treats pain. It works by numbing a specific area of the body, which blocks pain signals going to the brain. It belongs to a group of medications called local anesthetics. This medicine may be used for other purposes; ask your health care provider or pharmacist if you have questions. COMMON BRAND NAME(S): Aspercreme with Lidocaine, Blue-Emu, DERMALID, GEN7T, Lidocan, Lidocare, Lidoderm, Lidofore, LidoReal-30, Lidozo, Salonpas Lidocaine, Xyliderm, ZTlido What should I tell my care team before I take this medication? They need to know if you have any of these conditions: G6PD deficiency Heart disease Kidney disease Liver disease Skin conditions or sensitivity An unusual or allergic reaction to lidocaine, parabens, other medications, foods, dyes, or preservatives Pregnant or trying to get pregnant Breast-feeding How should I use this medication? This medication is for external use only. Use it as directed on the label. Do not apply to burned or damaged skin. Do not use it more often than directed. Keep using it unless your care team tells you to stop. Talk to your care team about the use of this medication in children. Special care may be needed. Overdosage: If you think you have taken too much of this medicine contact a poison control center or emergency room at once. NOTE: This medicine is only for you. Do not share this medicine with others. What if I miss a dose? If you miss a dose, use it as soon as you can. If it is almost time for your next dose, use only that dose. Do not use double or extra doses. What may interact with this medication? This medication may interact with the following: Acetaminophen Certain antibiotics, such as dapsone, nitrofurantoin, aminosalicylic acid, sulfasalazine Certain medications for seizures, such as phenobarbital, phenytoin, valproic  acid Chloroquine Cyclophosphamide Dofetilide Flutamide Hydroxyurea Ifosfamide Local anesthetics, such as lidocaine, pramoxine, tetracaine MAOIs, such as Carbex, Eldepryl, Marplan, Nardil, and Parnate Metoclopramide Moricizine Nitroglycerin Primaquine Saquinavir Quinine This list may not describe all possible interactions. Give your health care provider a list of all the medicines, herbs, non-prescription drugs, or dietary supplements you use. Also tell them if you smoke, drink alcohol, or use illegal drugs. Some items may interact with your medicine. What should I watch for while using this medication? Visit your care team for regular checks on your progress. Tell your care team if your symptoms do not start to get better or if they get worse. Be careful to avoid injury while the area is numb, and you are not aware of pain. If you are going to need surgery, an MRI, CT scan, or other procedure, tell your care team that you are using this medication. You may need to remove the patch before the procedure. What side effects may I notice from receiving  this medication? Side effects that you should report to your care team as soon as possible: Allergic reactions--skin rash, itching, hives, swelling of the face, lips, tongue, or throat Headache, unusual weakness or fatigue, shortness of breath, nausea, vomiting, rapid heartbeat, blue skin or lips, which may be signs of methemoglobinemia Heart rhythm changes--fast or irregular heartbeat, dizziness, feeling faint or lightheaded, chest pain, trouble breathing Side effects that usually do not require medical attention (report to your care team if they continue or are bothersome): Change in skin color Irritation at application site This list may not describe all possible side effects. Call your doctor for medical advice about side effects. You may report side effects to FDA at 1-800-FDA-1088. Where should I keep my medication? Keep out of the reach  of children and pets. See product for storage information. Each product may have different instructions. Get rid of any unused medication after the expiration date. To get rid of medications that are no longer needed or have expired: Take the medication to a medication take-back program. Check with your pharmacy or law enforcement to find a location. If you cannot return the medication, ask your pharmacist or care team how to get rid of this medication safely. NOTE: This sheet is a summary. It may not cover all possible information. If you have questions about this medicine, talk to your doctor, pharmacist, or health care provider.  2023 Elsevier/Gold Standard (2021-09-07 00:00:00)   OnabotulinumtoxinA Injection (Medical Use) What is this medication? ONABOTULINUMTOXINA (o na BOTT you lye num tox in eh) treats severe muscle spasms. It may also be used to prevent migraine headaches. It can treat excessive sweating when other medications do not work well enough. This medicine may be used for other purposes; ask your health care provider or pharmacist if you have questions. COMMON BRAND NAME(S): Botox What should I tell my care team before I take this medication? They need to know if you have any of these conditions: Breathing problems Cerebral palsy spasms Difficulty urinating Heart problems History of surgery where this medication is going to be used Infection at the site where this medication is going to be used Myasthenia gravis or other neurologic disease Nerve or muscle disease Surgery plans Take medications that treat or prevent blood clots Thyroid problems An unusual or allergic reaction to botulinum toxin, albumin, other medications, foods, dyes, or preservatives Pregnant or trying to get pregnant Breast-feeding How should I use this medication? This medication is for injected into a muscle. It is given by your care team in a hospital or clinic setting. A special MedGuide will  be given to you before each treatment. Be sure to read this information carefully each time. Talk to your care team about the use of this medication in children. While this medication may be prescribed for children as young as 2 years for selected conditions, precautions do apply. Overdosage: If you think you have taken too much of this medicine contact a poison control center or emergency room at once. NOTE: This medicine is only for you. Do not share this medicine with others. What if I miss a dose? This does not apply. What may interact with this medication? Aminoglycoside antibiotics, such as gentamicin, neomycin, tobramycin Muscle relaxants Other botulinum toxin injections This list may not describe all possible interactions. Give your health care provider a list of all the medicines, herbs, non-prescription drugs, or dietary supplements you use. Also tell them if you smoke, drink alcohol, or use illegal drugs. Some items may  interact with your medicine. What should I watch for while using this medication? Visit your care team for regular check ups. This medication will cause weakness in the muscle where it is injected. Tell your care team if you feel unusually weak in other muscles. Get medical help right away if you have problems with breathing, swallowing, or talking. This medication might make your eyelids droop or make you see blurry or double. If you have weak muscles or trouble seeing do not drive a car, use machinery, or do other dangerous activities. This medication contains albumin from human blood. It may be possible to pass an infection in this medication, but no cases have been reported. Talk to your care team about the risks and benefits of this medication. If your activities have been limited by your condition, go back to your regular routine slowly after treatment with this medication. What side effects may I notice from receiving this medication? Side effects that you should  report to your care team as soon as possible: Allergic reactions--skin rash, itching, hives, swelling of the face, lips, tongue, or throat Dryness or irritation of the eyes, eye pain, change in vision, sensitivity to light Infection--fever, chills, cough, sore throat, wounds that don't heal, pain or trouble when passing urine, general feeling of discomfort or being unwell Spread of botulinum toxin effects--unusual weakness or fatigue, blurry or double vision, trouble swallowing, hoarseness or trouble speaking, trouble breathing, loss of bladder control Trouble passing urine Side effects that usually do not require medical attention (report these to your care team if they continue or are bothersome): Dry mouth Eyelid drooping Fatigue Headache Pain, redness, or irritation at injection site This list may not describe all possible side effects. Call your doctor for medical advice about side effects. You may report side effects to FDA at 1-800-FDA-1088. Where should I keep my medication? This medication is given in a hospital or clinic and will not be stored at home. NOTE: This sheet is a summary. It may not cover all possible information. If you have questions about this medicine, talk to your doctor, pharmacist, or health care provider.  2023 Elsevier/Gold Standard (2021-07-02 00:00:00)   Alprazolam Tablets What is this medication? ALPRAZOLAM (al PRAY zoe lam) treats anxiety. It works by Child psychotherapist system calm down. It belongs to a group of medications called benzodiazepines. This medicine may be used for other purposes; ask your health care provider or pharmacist if you have questions. COMMON BRAND NAME(S): Xanax What should I tell my care team before I take this medication? They need to know if you have any of these conditions: Kidney disease Liver disease Lung disease, such as asthma or breathing problems Mental health condition Seizures Substance use disorder Suicidal  thoughts, plans, or attempt by you or a family member An unusual or allergic reaction to alprazolam, other medications, foods, dyes, or preservatives Pregnant or trying to get pregnant Breastfeeding How should I use this medication? Take this medication by mouth. Take it as directed on the prescription label. Do not take it more often than directed. Keep taking it unless your care team tells you to stop. A special MedGuide will be given to you by the pharmacist with each prescription and refill. Be sure to read this information carefully each time. Talk to your care team about the use of this medication in children. Special care may be needed. People 65 years and older may have a stronger reaction and need a smaller dose. Overdosage: If you think  you have taken too much of this medicine contact a poison control center or emergency room at once. NOTE: This medicine is only for you. Do not share this medicine with others. What if I miss a dose? If you miss a dose, take it as soon as you can. If it is almost time for your next dose, take only that dose. Do not take double or extra doses. What may interact with this medication? Do not take this medication with any of the following: Adagrasib Certain antivirals for HIV or hepatitis Certain medications for fungal infections, such as ketoconazole, itraconazole, or posaconazole Clarithromycin Grapefruit juice Opioid medications for cough Sodium oxybate This medication may also interact with the following: Alcohol Antihistamines for allergy, cough and cold Certain medications for anxiety or sleep Certain medications for depression, such as amitriptyline, fluoxetine, fluvoxamine, nefazodone, sertraline Certain medications for seizures, such as carbamazepine, phenobarbital, phenytoin, primidone Cimetidine Digoxin Erythromycin Estrogen or progestin hormones General anesthetics, such as halothane, isoflurane, methoxyflurane, propofol Medications  that relax muscles Opioid medications for pain Phenothiazines, such as chlorpromazine, mesoridazine, prochlorperazine, thioridazine This list may not describe all possible interactions. Give your health care provider a list of all the medicines, herbs, non-prescription drugs, or dietary supplements you use. Also tell them if you smoke, drink alcohol, or use illegal drugs. Some items may interact with your medicine. What should I watch for while using this medication? Visit your care team for regular checks on your progress. Tell your care team if your symptoms do not start to get better or if they get worse. Do not stop taking except on your care team's advice. You may develop a severe reaction. Your care team will tell you how much medication to take. Long term use of this medication may cause your brain and body to depend on it. This can happen even when used as directed by your care team. You and your care team will work together to determine how long you will need to take this medication. This medication may affect your coordination, reaction time, or judgment. Do not drive or operate machinery until you know how this medication affects you. Sit up or stand slowly to reduce the risk of dizzy or fainting spells. Drinking alcohol with this medication can increase the risk of these side effects. If you are taking another medication that also causes drowsiness, you may have more side effects. Give your care team a list of all medications you use. Your care team will tell you how much medication to take. Do not take more medication than directed. Get emergency help right away if you have problems breathing or unusual sleepiness. If you or your family notice any changes in your behavior, such as new or worsening depression, thoughts of harming yourself, anxiety, other unusual or disturbing thoughts, or memory loss, call your care team right away. Talk to your care team if you wish to become pregnant or think  you might be pregnant. This medication can cause serious birth defects. Talk to your care team before breastfeeding. Changes to your treatment plan may be needed. What side effects may I notice from receiving this medication? Side effects that you should report to your care team as soon as possible: Allergic reactions--skin rash, itching, hives, swelling of the face, lips, tongue, or throat CNS depression--slow or shallow breathing, shortness of breath, feeling faint, dizziness, confusion, trouble staying awake Thoughts of suicide or self-harm, worsening mood, feelings of depression Side effects that usually do not require medical attention (report  to your care team if they continue or are bothersome): Change in sex drive or performance Dizziness Drowsiness Nausea This list may not describe all possible side effects. Call your doctor for medical advice about side effects. You may report side effects to FDA at 1-800-FDA-1088. Where should I keep my medication? Keep out of the reach of children and pets. This medication can be abused. Keep it in a safe place to protect it from theft. Do not share it with anyone. It is only for you. Selling or giving away this medication is dangerous and against the law. Store at room temperature between 20 and 25 degrees C (68 and 77 degrees F). Get rid of any unused medication after the expiration date. This medication may cause harm and death if it is taken by other adults, children, or pets. It is important to get rid of the medication as soon as you no longer need it, or it is expired. You can do this in two ways: Take the medication to a medication take-back program. Check with your pharmacy or law enforcement to find a location. If you cannot return the medication, check the label or package insert to see if the medication should be thrown out in the garbage or flushed down the toilet. If you are not sure, ask your care team. If it is safe to put it in the  trash, take the medication out of the container. Mix the medication with cat litter, dirt, coffee grounds, or other unwanted substance. Seal the mixture in a bag or container. Put it in the trash. NOTE: This sheet is a summary. It may not cover all possible information. If you have questions about this medicine, talk to your doctor, pharmacist, or health care provider.  2023 Elsevier/Gold Standard (2007-10-10 00:00:00)   Analgesic Rebound Headache An analgesic rebound headache, sometimes called a medication overuse headache or a drug-induced headache, is a secondary disorder that is caused by the overuse of pain medicine (analgesic) to treat the original (primary) headache. Any type of primary headache can return as a rebound headache if a person regularly takes analgesics. The types of primary headaches that are commonly associated with rebound headaches include: Migraines. Headaches that are caused by tense muscles in the head and neck area (tension headaches). Headaches that develop and happen again on one side of the head and around the eye (cluster headaches). If rebound headaches continue, they can become long-term, daily headaches. What are the causes? This condition may be caused by frequent use of: Over-the-counter medicines such as aspirin, ibuprofen, and acetaminophen. Sinus-relief medicines and medicines that contain caffeine. Narcotic pain medicines such as codeine and oxycodone. Some prescription migraine medicines. What are the signs or symptoms? The symptoms of a rebound headache are the same as the symptoms of the original headache. Some of the symptoms of specific types of headaches include: Migraine headache Pulsing or throbbing pain on one or both sides of the head. Severe pain that interferes with daily activities. Pain that gets worse with physical activity. Nausea, vomiting, or both. Pain and sensitivity with exposure to bright light, loud noises, or strong  smells. Visual changes. Numbness of one or both arms. Tension headache Pressure around the head. Dull, aching head pain. Pain felt over the front and sides of the head. Tenderness in the muscles of the head, neck, and shoulders. Cluster headache Severe pain that begins in or around one eye or temple. Droopy or swollen eyelid, or redness and tearing in the eye on  the same side as the pain. One-sided head pain. Nausea. Runny nose. Sweaty, pale facial skin. Restlessness. How is this diagnosed? This condition is diagnosed by: Reviewing your medical history. This includes the nature of your primary headaches. Reviewing the types of pain medicines that you have been using to treat your primary headaches and how often you take them. How is this treated? This condition may be treated or managed by: Discontinuing frequent use of the analgesic medicine. Doing this may worsen your headaches at first, but the pain should eventually become more manageable, less frequent, and less severe. Seeing a headache specialist. He or she may be able to help you manage your headaches and help make sure there is not another cause of the headaches. Using methods of stress relief, such as acupuncture, counseling, biofeedback, and massage. Talk with your health care provider about which methods might be good for you. Follow these instructions at home: Medicines  Take over-the-counter and prescription medicines only as told by your health care provider. Stop the repeated use of pain medicine as told by your health care provider. Stopping can be difficult. Carefully follow instructions from your health care provider. Lifestyle Follow a regular sleep schedule. Do not vary the time that you go to bed or the amount that you sleep from day to day. It is important to stay on the same schedule to help prevent headaches. Get 7-9 hours of sleep each night, or the amount recommended by your health care provider. Exercise  regularly. Exercise for at least 30 minutes, 5 times each week. Limit or manage stress. Consider stress-relief options such as acupuncture, counseling, biofeedback, and massage. Talk with your health care provider about which methods might be good for you. Do not drink alcohol. Do not use any products that contain nicotine or tobacco, such as cigarettes, e-cigarettes, and chewing tobacco. If you need help quitting, ask your health care provider. General instructions Avoid triggers that are known to cause your primary headaches. Keep all follow-up visits as told by your health care provider. This is important. Contact a health care provider if: You continue to experience headaches after following treatments that your health care provider recommended. Get help right away if you have: New headache pain. Headache pain that is different than what you have experienced in the past. Numbness or tingling in your arms or legs. Changes in your speech or vision. Summary An analgesic rebound headache, sometimes called a medication overuse headache or a drug-induced headache, is caused by the overuse of pain medicine (analgesic) to treat the original (primary) headache. Any type of primary headache can return as a rebound headache if a person regularly takes analgesics. The types of primary headaches that are commonly associated with rebound headaches include migraines, tension headaches, and cluster headaches. Analgesic rebound headaches can occur with frequent use of over-the-counter medicines and prescription medicines. Treatment involves stopping the medicine that is being overused. This will improve headache frequency and severity. This information is not intended to replace advice given to you by your health care provider. Make sure you discuss any questions you have with your health care provider. Document Revised: 01/31/2022 Document Reviewed: 09/16/2019 Elsevier Patient Education  [place] Injection What is this medication? FREMANEZUMAB (fre ma NEZ ue mab) prevents migraines. It works by blocking a substance in the body that causes migraines. It is a monoclonal antibody. This medicine may be used for other purposes; ask your health care provider or pharmacist if you have  questions. COMMON BRAND NAME(S): AJOVY What should I tell my care team before I take this medication? They need to know if you have any of these conditions: An unusual or allergic reaction to fremanezumab, other medications, foods, dyes, or preservatives Pregnant or trying to get pregnant Breast-feeding How should I use this medication? This medication is injected under the skin. You will be taught how to prepare and give it. Take it as directed on the prescription label. Keep taking it unless your care team tells you to stop. It is important that you put your used needles and syringes in a special sharps container. Do not put them in a trash can. If you do not have a sharps container, call your pharmacist or care team to get one. Talk to your care team about the use of this medication in children. Special care may be needed. Overdosage: If you think you have taken too much of this medicine contact a poison control center or emergency room at once. NOTE: This medicine is only for you. Do not share this medicine with others. What if I miss a dose? If you miss a dose, take it as soon as you can. If it is almost time for your next dose, take only that dose. Do not take double or extra doses. What may interact with this medication? Interactions are not expected. This list may not describe all possible interactions. Give your health care provider a list of all the medicines, herbs, non-prescription drugs, or dietary supplements you use. Also tell them if you smoke, drink alcohol, or use illegal drugs. Some items may interact with your medicine. What should I watch for while using this  medication? Tell your care team if your symptoms do not start to get better or if they get worse. What side effects may I notice from receiving this medication? Side effects that you should report to your care team as soon as possible: Allergic reactions or angioedema--skin rash, itching or hives, swelling of the face, eyes, lips, tongue, arms, or legs, trouble swallowing or breathing Side effects that usually do not require medical attention (report to your care team if they continue or are bothersome): Pain, redness, or irritation at injection site This list may not describe all possible side effects. Call your doctor for medical advice about side effects. You may report side effects to FDA at 1-800-FDA-1088. Where should I keep my medication? Keep out of the reach of children and pets. Store in a refrigerator or at room temperature between 20 and 25 degrees C (68 and 77 degrees F). Refrigeration (preferred): Store in the refrigerator. Do not freeze. Keep in the original container until you are ready to take it. Remove the dose from the carton about 30 minutes before it is time for you to use it. If the dose is not used, it may be stored in the original container at room temperature for 7 days. Get rid of any unused medication after the expiration date. Room Temperature: This medication may be stored at room temperature for up to 7 days. Keep it in the original container. Protect from light until time of use. If it is stored at room temperature, get rid of any unused medication after 7 days or after it expires, whichever is first. To get rid of medications that are no longer needed or have expired: Take the medication to a medication take-back program. Check with your pharmacy or law enforcement to find a location. If you cannot return the medication, ask  your pharmacist or care team how to get rid of this medication safely. NOTE: This sheet is a summary. It may not cover all possible information.  If you have questions about this medicine, talk to your doctor, pharmacist, or health care provider.  2023 Elsevier/Gold Standard (2017-05-20 00:00:00)

## 2022-07-10 ENCOUNTER — Other Ambulatory Visit (HOSPITAL_COMMUNITY): Payer: Self-pay

## 2022-07-10 NOTE — Telephone Encounter (Signed)
BotoxOne benefit verification submitted  Key: [id number]

## 2022-07-10 NOTE — Telephone Encounter (Signed)
Chronic Migraine CPT 64615  Botox J0585 Units:200  G43.711 Chronic Migraine without aura, intractable, with status migrainous  

## 2022-07-10 NOTE — Telephone Encounter (Signed)
Patient Advocate Encounter   Received notification that prior authorization for Botox 200UNIT solution is required.   PA submitted on 07/10/2022 Key [id number] Status is pending       Lyndel Safe, CPhT Pharmacy Patient Advocate Specialist [facility] Patient Advocate Team Direct Number: [phone]  Fax: [phone]

## 2022-07-11 ENCOUNTER — Other Ambulatory Visit (HOSPITAL_COMMUNITY): Payer: Self-pay

## 2022-07-11 MED ORDER — BOTOX 200 UNITS IJ SOLR
INTRAMUSCULAR | 3 refills | Status: DC
  Filled 2022-07-11: qty 1, fill #0
  Filled 2022-07-15: qty 1, 90d supply, fill #0
  Filled 2022-10-18: qty 1, 90d supply, fill #1
  Filled 2023-02-10: qty 1, 84d supply, fill #2
  Filled 2023-04-30: qty 1, 84d supply, fill #3

## 2022-07-11 NOTE — Telephone Encounter (Signed)
Botox 200 unit prescription has been sent to [facility] outpatient pharmacy.   Please call patient and schedule her with an NP. Tell her Dr Jaynee Eagles approves this. IF there are no botox openings within the next 3-4 weeks with an NP then you can offer a 4 pm add-on with Dr Jaynee Eagles for this first injection. Please put in the appt note "Botox SP PA# [id number] (07/10/2022 - 01/08/2023)"

## 2022-07-11 NOTE — Telephone Encounter (Signed)
Per Dr Jaynee Eagles, ok to schedule with NP. If pt unable to come during the day can add on at 4 pm with Dr Jaynee Eagles.

## 2022-07-11 NOTE — Telephone Encounter (Signed)
Patient Advocate Encounter  Prior Authorization for Botox 200UNIT solution has been approved.    PA# [id number] Effective dates: 07/10/2022 through 01/08/2023  Can be filled at [place], [place] Patient [place] Patient Advocate Team Direct Number: [phone]  Fax: [phone]

## 2022-07-11 NOTE — Telephone Encounter (Signed)
Scheduled Botox with Sarah for 11/30 at 3:45 pm.

## 2022-07-11 NOTE — Addendum Note (Signed)
Addended by: Gildardo Griffes on: 07/11/2022 02:45 PM   Modules accepted: Orders

## 2022-07-12 ENCOUNTER — Other Ambulatory Visit (HOSPITAL_COMMUNITY): Payer: Self-pay

## 2022-07-15 ENCOUNTER — Other Ambulatory Visit (HOSPITAL_COMMUNITY): Payer: Self-pay

## 2022-07-16 ENCOUNTER — Other Ambulatory Visit (HOSPITAL_COMMUNITY): Payer: Self-pay

## 2022-07-26 ENCOUNTER — Other Ambulatory Visit (HOSPITAL_COMMUNITY): Payer: Self-pay

## 2022-07-29 ENCOUNTER — Other Ambulatory Visit (HOSPITAL_COMMUNITY): Payer: Self-pay

## 2022-08-01 ENCOUNTER — Ambulatory Visit: Payer: No Typology Code available for payment source | Admitting: Neurology

## 2022-08-07 ENCOUNTER — Encounter: Payer: Self-pay | Admitting: Neurology

## 2022-08-07 ENCOUNTER — Ambulatory Visit (INDEPENDENT_AMBULATORY_CARE_PROVIDER_SITE_OTHER): Payer: No Typology Code available for payment source | Admitting: Neurology

## 2022-08-07 ENCOUNTER — Other Ambulatory Visit: Payer: No Typology Code available for payment source

## 2022-08-07 VITALS — BP 115/80 | HR 69

## 2022-08-07 DIAGNOSIS — G43711 Chronic migraine without aura, intractable, with status migrainosus: Secondary | ICD-10-CM

## 2022-08-07 MED ORDER — ONABOTULINUMTOXINA 200 UNITS IJ SOLR
200.0000 [IU] | Freq: Once | INTRAMUSCULAR | Status: AC
  Administered 2022-08-07: 155 [IU] via INTRAMUSCULAR

## 2022-08-07 NOTE — Progress Notes (Signed)
    BOTOX PROCEDURE NOTE FOR MIGRAINE HEADACHE   HISTORY: Bonnie Lester is here for her first Botox injection.  Her last Botox injection was about a year ago, she only did 1 cycle, the injections were very painful, specifically to the left trapezius area.  She has done 1 injection of Ajovy (Dr. Jaynee Eagles gave her a sample, she has another).  Has noted benefit, she had 2 Imitrex tablets left at the end of the month.  She did not take the Xanax or lidocaine prior to coming.  Description of procedure:  The patient was placed in a sitting position. The standard protocol was used for Botox as follows, with 5 units of Botox injected at each site:   -Procerus muscle, midline injection  -Corrugator muscle, bilateral injection  -Frontalis muscle, bilateral injection, with 2 sites each side, medial injection was performed in the upper one third of the frontalis muscle, in the region vertical from the medial inferior edge of the superior orbital rim. The lateral injection was again in the upper one third of the forehead vertically above the lateral limbus of the cornea, 1.5 cm lateral to the medial injection site.  -Temporalis muscle injection, 4 sites, bilaterally. The first injection was 3 cm above the tragus of the ear, second injection site was 1.5 cm to 3 cm up from the first injection site in line with the tragus of the ear. The third injection site was 1.5-3 cm forward between the first 2 injection sites. The fourth injection site was 1.5 cm posterior to the second injection site.  -Occipitalis muscle injection, 3 sites, bilaterally. The first injection was done one half way between the occipital protuberance and the tip of the mastoid process behind the ear. The second injection site was done lateral and superior to the first, 1 fingerbreadth from the first injection. The third injection site was 1 fingerbreadth superiorly and medially from the first injection site.  -Cervical paraspinal muscle  injection, 2 sites, bilateral, the first injection site was 1 cm from the midline of the cervical spine, 3 cm inferior to the lower border of the occipital protuberance. The second injection site was 1.5 cm superiorly and laterally to the first injection site.  -Trapezius muscle injection was performed at 3 sites, bilaterally. The first injection site was in the upper trapezius muscle halfway between the inflection point of the neck, and the acromion. The second injection site was one half way between the acromion and the first injection site. The third injection was done between the first injection site and the inflection point of the neck.   A 200 unit bottle of Botox was used, 155 units were injected, the rest of the Botox was wasted. The patient tolerated the procedure well, there were no complications of the above procedure.   Botox NDC 3220-2542-70 Lot number [id number] Expiration date 12/2024 SP

## 2022-08-07 NOTE — Progress Notes (Signed)
Botox 200 units x 1 vial  Ndc-0023-3921-02 [id number] Exp-12/2024 S/P

## 2022-08-09 ENCOUNTER — Telehealth: Payer: Self-pay | Admitting: Pulmonary Disease

## 2022-08-09 NOTE — Telephone Encounter (Signed)
Patient returned my call to sch HST and informed me that she would like to decline the sleep study being done. FYI

## 2022-10-15 ENCOUNTER — Other Ambulatory Visit (HOSPITAL_COMMUNITY): Payer: Self-pay

## 2022-10-18 ENCOUNTER — Other Ambulatory Visit (HOSPITAL_COMMUNITY): Payer: Self-pay

## 2022-10-21 ENCOUNTER — Other Ambulatory Visit: Payer: Self-pay

## 2022-10-24 ENCOUNTER — Ambulatory Visit: Payer: No Typology Code available for payment source | Admitting: Neurology

## 2022-11-05 ENCOUNTER — Ambulatory Visit (INDEPENDENT_AMBULATORY_CARE_PROVIDER_SITE_OTHER): Payer: No Typology Code available for payment source | Admitting: Neurology

## 2022-11-05 VITALS — BP 108/69 | HR 91

## 2022-11-05 DIAGNOSIS — G43711 Chronic migraine without aura, intractable, with status migrainosus: Secondary | ICD-10-CM | POA: Diagnosis not present

## 2022-11-05 MED ORDER — SUMATRIPTAN SUCCINATE 100 MG PO TABS: 100.0000 mg | ORAL_TABLET | Freq: Two times a day (BID) | ORAL | 11 refills | Status: DC | PRN

## 2022-11-05 MED ORDER — ONABOTULINUMTOXINA 200 UNITS IJ SOLR
200.0000 [IU] | Freq: Once | INTRAMUSCULAR | Status: AC
  Administered 2022-11-05: 200 [IU] via INTRAMUSCULAR

## 2022-11-05 NOTE — Progress Notes (Signed)
Botox 200 units x 1 vial Ndc-0023-3921-02 [id number]-01-2025 S/P

## 2022-11-05 NOTE — Progress Notes (Signed)
   BOTOX PROCEDURE NOTE FOR MIGRAINE HEADACHE  HISTORY: Bonnie Lester is here for her 2nd Botox injection. Her last was 08/07/22 with me.  She did 3 months of Ajovy, didn't see much benefit with it, felt constipation.  She has just started an elimination diet. Usually a migraine once a month that nothing helps, has to stay in bed, this hasn't happened since Botox.  Nurtec isn't as helpful for acute relief. Takes Imitrex 100 mg, gets 9 tablets a month, but needs 12. Right now taking all tablets monthly. This month will be 1 year since last period, hoping menopause will improve migraines. Started Lexapro in December, she didn't like it for 1 month, felt tired.   Description of procedure:  The patient was placed in a sitting position. The standard protocol was used for Botox as follows, with 5 units of Botox injected at each site:   -Procerus muscle, midline injection  -Corrugator muscle, bilateral injection  -Frontalis muscle, bilateral injection, with 2 sites each side, medial injection was performed in the upper one third of the frontalis muscle, in the region vertical from the medial inferior edge of the superior orbital rim. The lateral injection was again in the upper one third of the forehead vertically above the lateral limbus of the cornea, 1.5 cm lateral to the medial injection site.  -Temporalis muscle injection, 4 sites, bilaterally. The first injection was 3 cm above the tragus of the ear, second injection site was 1.5 cm to 3 cm up from the first injection site in line with the tragus of the ear. The third injection site was 1.5-3 cm forward between the first 2 injection sites. The fourth injection site was 1.5 cm posterior to the second injection site.  -Occipitalis muscle injection, 3 sites, bilaterally. The first injection was done one half way between the occipital protuberance and the tip of the mastoid process behind the ear. The second injection site was done lateral and superior to the  first, 1 fingerbreadth from the first injection. The third injection site was 1 fingerbreadth superiorly and medially from the first injection site.  -Cervical paraspinal muscle injection, 2 sites, bilateral, the first injection site was 1 cm from the midline of the cervical spine, 3 cm inferior to the lower border of the occipital protuberance. The second injection site was 1.5 cm superiorly and laterally to the first injection site.  -Trapezius muscle injection was performed at 3 sites, bilaterally. The first injection site was in the upper trapezius muscle halfway between the inflection point of the neck, and the acromion. The second injection site was one half way between the acromion and the first injection site. The third injection was done between the first injection site and the inflection point of the neck.   A 200 unit bottle of Botox was used, 155 units were injected, the rest of the Botox was wasted. The patient tolerated the procedure well, there were no complications of the above procedure.  Botox NDC [id number] Lot number [id number] Expiration date 01/2025 SP  I sent in Imitrex with # 12 tablets vs # 9 from her GYN

## 2023-01-16 ENCOUNTER — Other Ambulatory Visit (HOSPITAL_COMMUNITY): Payer: Self-pay

## 2023-01-20 ENCOUNTER — Telehealth: Payer: Self-pay | Admitting: Neurology

## 2023-01-20 NOTE — Telephone Encounter (Signed)
Initiated new Botox auth, current auth expired 01/09/23. Submitted via [employer] portal, status is pending. Ref # [id number].

## 2023-01-28 NOTE — Telephone Encounter (Signed)
Bonnie Lester has been approved: [id number] (01/20/23-01/20/24).

## 2023-02-06 ENCOUNTER — Other Ambulatory Visit (HOSPITAL_COMMUNITY): Payer: Self-pay

## 2023-02-10 ENCOUNTER — Other Ambulatory Visit (HOSPITAL_COMMUNITY): Payer: Self-pay

## 2023-02-10 ENCOUNTER — Other Ambulatory Visit: Payer: Self-pay

## 2023-02-12 ENCOUNTER — Other Ambulatory Visit (HOSPITAL_COMMUNITY): Payer: Self-pay

## 2023-02-13 ENCOUNTER — Other Ambulatory Visit: Payer: Self-pay

## 2023-02-13 ENCOUNTER — Other Ambulatory Visit (HOSPITAL_COMMUNITY): Payer: Self-pay

## 2023-02-13 NOTE — Telephone Encounter (Signed)
Tiffany, Pt advocate from [facility] pharmacy is asking for a call to discuss the message she received about other medications needing to be tried. She can be reached at [phone]

## 2023-02-17 ENCOUNTER — Other Ambulatory Visit (HOSPITAL_COMMUNITY): Payer: Self-pay

## 2023-02-17 ENCOUNTER — Other Ambulatory Visit: Payer: Self-pay

## 2023-02-17 NOTE — Telephone Encounter (Signed)
Botox TBD 02/18/23.

## 2023-02-18 ENCOUNTER — Ambulatory Visit (INDEPENDENT_AMBULATORY_CARE_PROVIDER_SITE_OTHER): Payer: No Typology Code available for payment source | Admitting: Neurology

## 2023-02-18 VITALS — BP 104/64 | HR 81

## 2023-02-18 DIAGNOSIS — G44021 Chronic cluster headache, intractable: Secondary | ICD-10-CM

## 2023-02-18 DIAGNOSIS — G43711 Chronic migraine without aura, intractable, with status migrainosus: Secondary | ICD-10-CM

## 2023-02-18 DIAGNOSIS — R51 Headache with orthostatic component, not elsewhere classified: Secondary | ICD-10-CM | POA: Diagnosis not present

## 2023-02-18 DIAGNOSIS — G43101 Migraine with aura, not intractable, with status migrainosus: Secondary | ICD-10-CM | POA: Diagnosis not present

## 2023-02-18 DIAGNOSIS — R519 Headache, unspecified: Secondary | ICD-10-CM

## 2023-02-18 MED ORDER — ONABOTULINUMTOXINA 200 UNITS IJ SOLR
155.0000 [IU] | Freq: Once | INTRAMUSCULAR | Status: AC
  Administered 2023-02-18: 155 [IU] via INTRAMUSCULAR

## 2023-02-18 NOTE — Progress Notes (Signed)
   BOTOX PROCEDURE NOTE FOR MIGRAINE HEADACHE  HISTORY: Bonnie Lester is here for Botox, last was 11/05/22 by me. This will be her 3rd injection.  The whole month of May she felt great, did really well in April as well, no headaches. The last 2 weeks the headaches have returned in the afternoon. She did have to get new script for an astigmatism for lens. Since Botox, hasn't had any severe migraines where she couldn't get out of bed. Twice in the last 2 weeks had aura, fuzzy black lines, vision loss in right eye. Remains on Imitrex, works very well when she takes. Leaving tomorrow for [place].  We were a few weeks late for Botox.   Description of procedure:  The patient was placed in a sitting position. The standard protocol was used for Botox as follows, with 5 units of Botox injected at each site:   -Procerus muscle, midline injection  -Corrugator muscle, bilateral injection  -Frontalis muscle, bilateral injection, with 2 sites each side, medial injection was performed in the upper one third of the frontalis muscle, in the region vertical from the medial inferior edge of the superior orbital rim. The lateral injection was again in the upper one third of the forehead vertically above the lateral limbus of the cornea, 1.5 cm lateral to the medial injection site.  -Temporalis muscle injection, 4 sites, bilaterally. The first injection was 3 cm above the tragus of the ear, second injection site was 1.5 cm to 3 cm up from the first injection site in line with the tragus of the ear. The third injection site was 1.5-3 cm forward between the first 2 injection sites. The fourth injection site was 1.5 cm posterior to the second injection site.  -Occipitalis muscle injection, 3 sites, bilaterally. The first injection was done one half way between the occipital protuberance and the tip of the mastoid process behind the ear. The second injection site was done lateral and superior to the first, 1 fingerbreadth from the  first injection. The third injection site was 1 fingerbreadth superiorly and medially from the first injection site.  -Cervical paraspinal muscle injection, 2 sites, bilateral, the first injection site was 1 cm from the midline of the cervical spine, 3 cm inferior to the lower border of the occipital protuberance. The second injection site was 1.5 cm superiorly and laterally to the first injection site.  -Trapezius muscle injection was performed at 3 sites, bilaterally. The first injection site was in the upper trapezius muscle halfway between the inflection point of the neck, and the acromion. The second injection site was one half way between the acromion and the first injection site. The third injection was done between the first injection site and the inflection point of the neck.   A 200 unit bottle of Botox was used, 155 units were injected, the rest of the Botox was wasted. The patient tolerated the procedure well, there were no complications of the above procedure.  Botox NDC 1610-9604-54 Lot number [id number]  Expiration date 04/2025 SP  Leaving for [place] tomorrow. We gave her 2 boxes of nurtec, she will take daily for the 5 days to break cycle of migraine as needed.

## 2023-02-18 NOTE — Progress Notes (Signed)
Botox- 200 units x 1 vial Lot: [id number] Expiration: 2026/08 NDC: 0023-3921-02  Bacteriostatic 0.9% Sodium Chloride- 4mL  Lot: [id number] Expiration: 12/02/23 NDC: 5621-3086-57  Dx: Q46.962 S/P  Witnessed by Athena Masse RN

## 2023-04-30 ENCOUNTER — Other Ambulatory Visit (HOSPITAL_COMMUNITY): Payer: Self-pay

## 2023-05-07 ENCOUNTER — Telehealth: Payer: Self-pay

## 2023-05-07 ENCOUNTER — Other Ambulatory Visit (HOSPITAL_COMMUNITY): Payer: Self-pay

## 2023-05-07 NOTE — Telephone Encounter (Signed)
Received a message from [facility] that PT Botox is not covered under Pharmacy benefits.

## 2023-05-07 NOTE — Telephone Encounter (Signed)
LVM and sent MyChart message asking for new insurance info, pt's [employer] termed 05/03/23.

## 2023-05-08 NOTE — Telephone Encounter (Signed)
Left another vm asking for call back with pt's new insurance info.

## 2023-05-12 NOTE — Telephone Encounter (Signed)
Left another VM cancelling pt's appt, we still have not heard back with new insurance info and likely will not have time to get authorized before 9/11. Left our call back # asking for returned call.

## 2023-05-14 ENCOUNTER — Ambulatory Visit: Payer: Self-pay | Admitting: Neurology

## 2023-05-18 IMAGING — DX DG CHEST 2V
2 series · 2 of 2 positions shown · non-contrast
Comparison: 02/17/2019

CLINICAL DATA: Chest pain, chest pressure and shortness of breath
for 2 months worsened over past few days, abnormal EKG, history
asthma

EXAM:
CHEST - 2 VIEW

[chest pa]
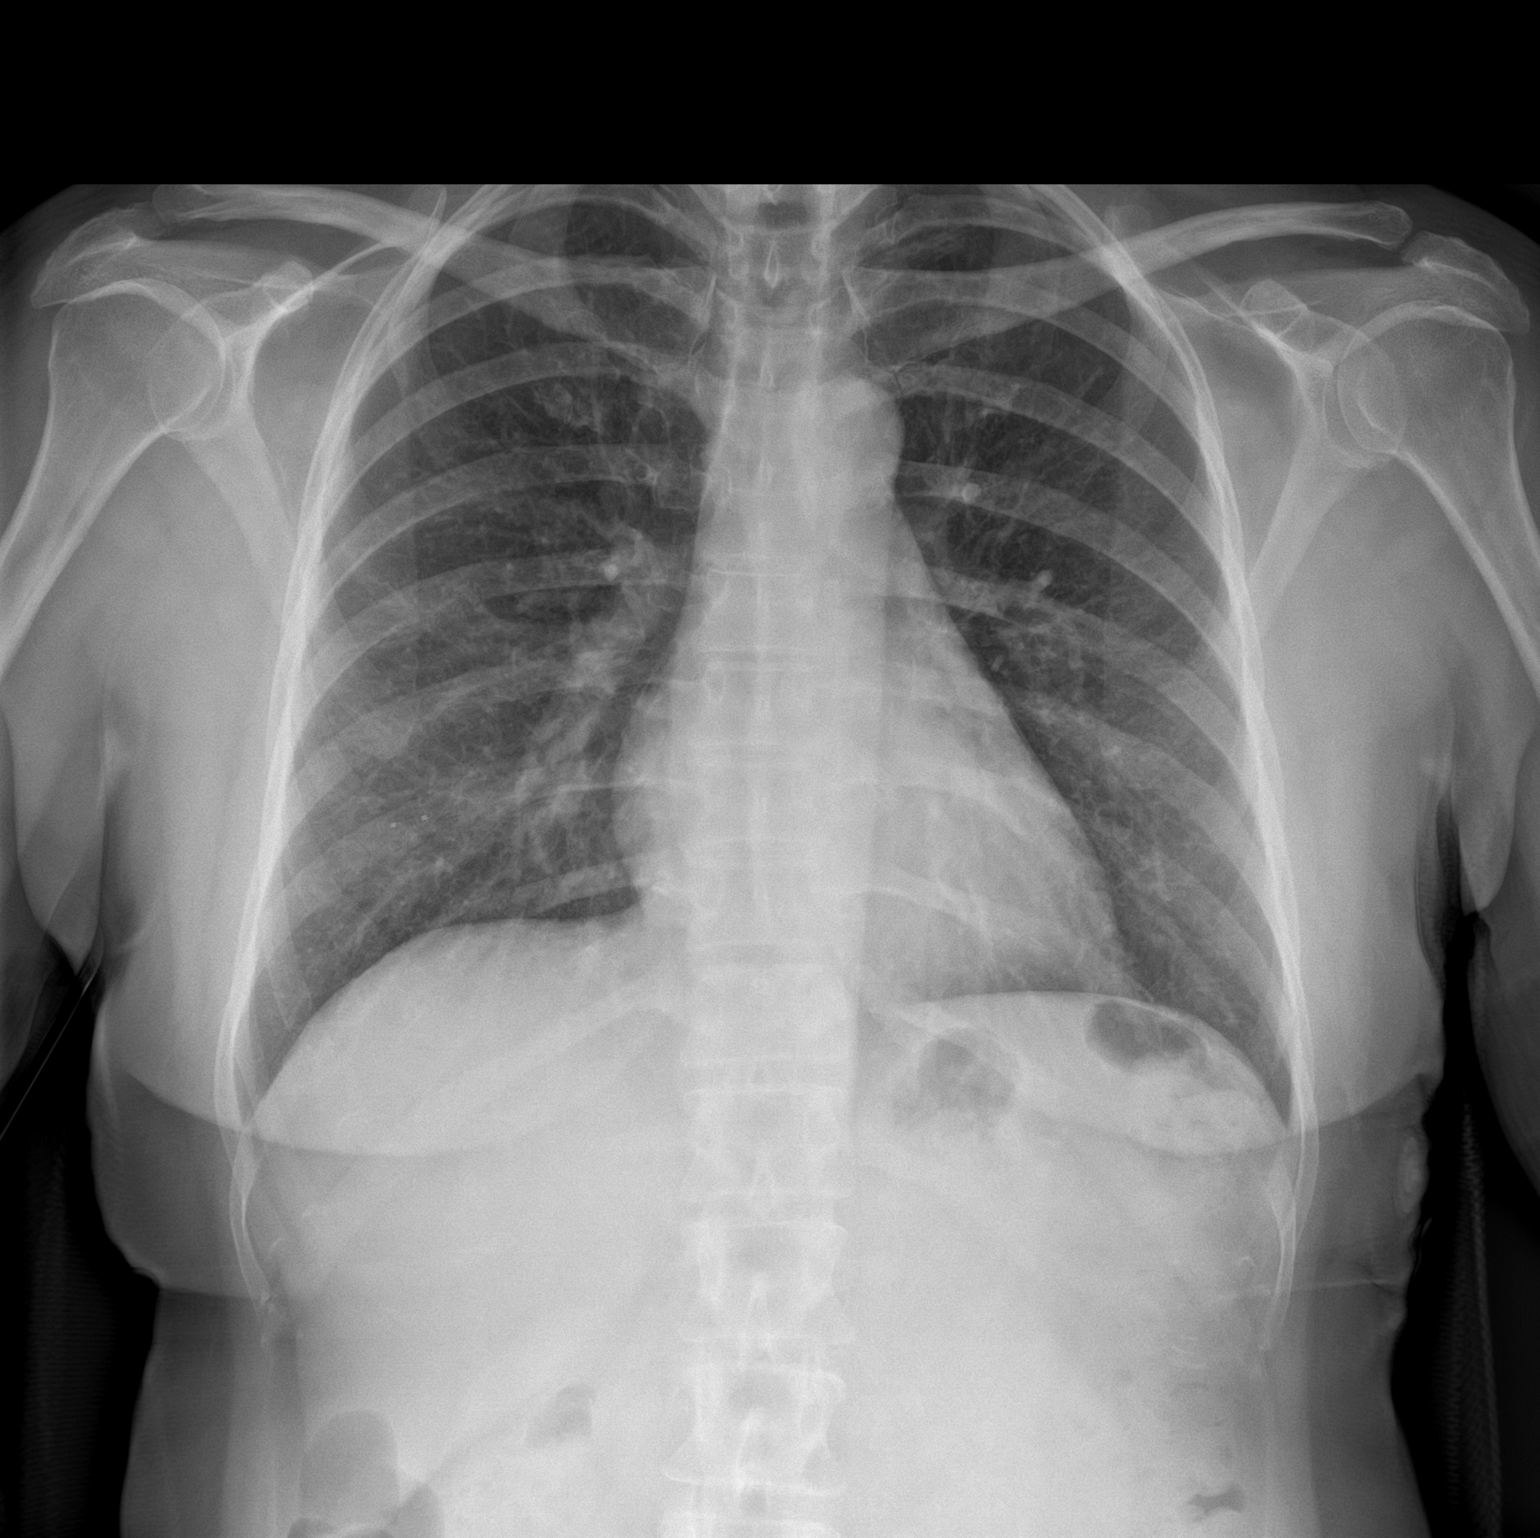

[chest lat]
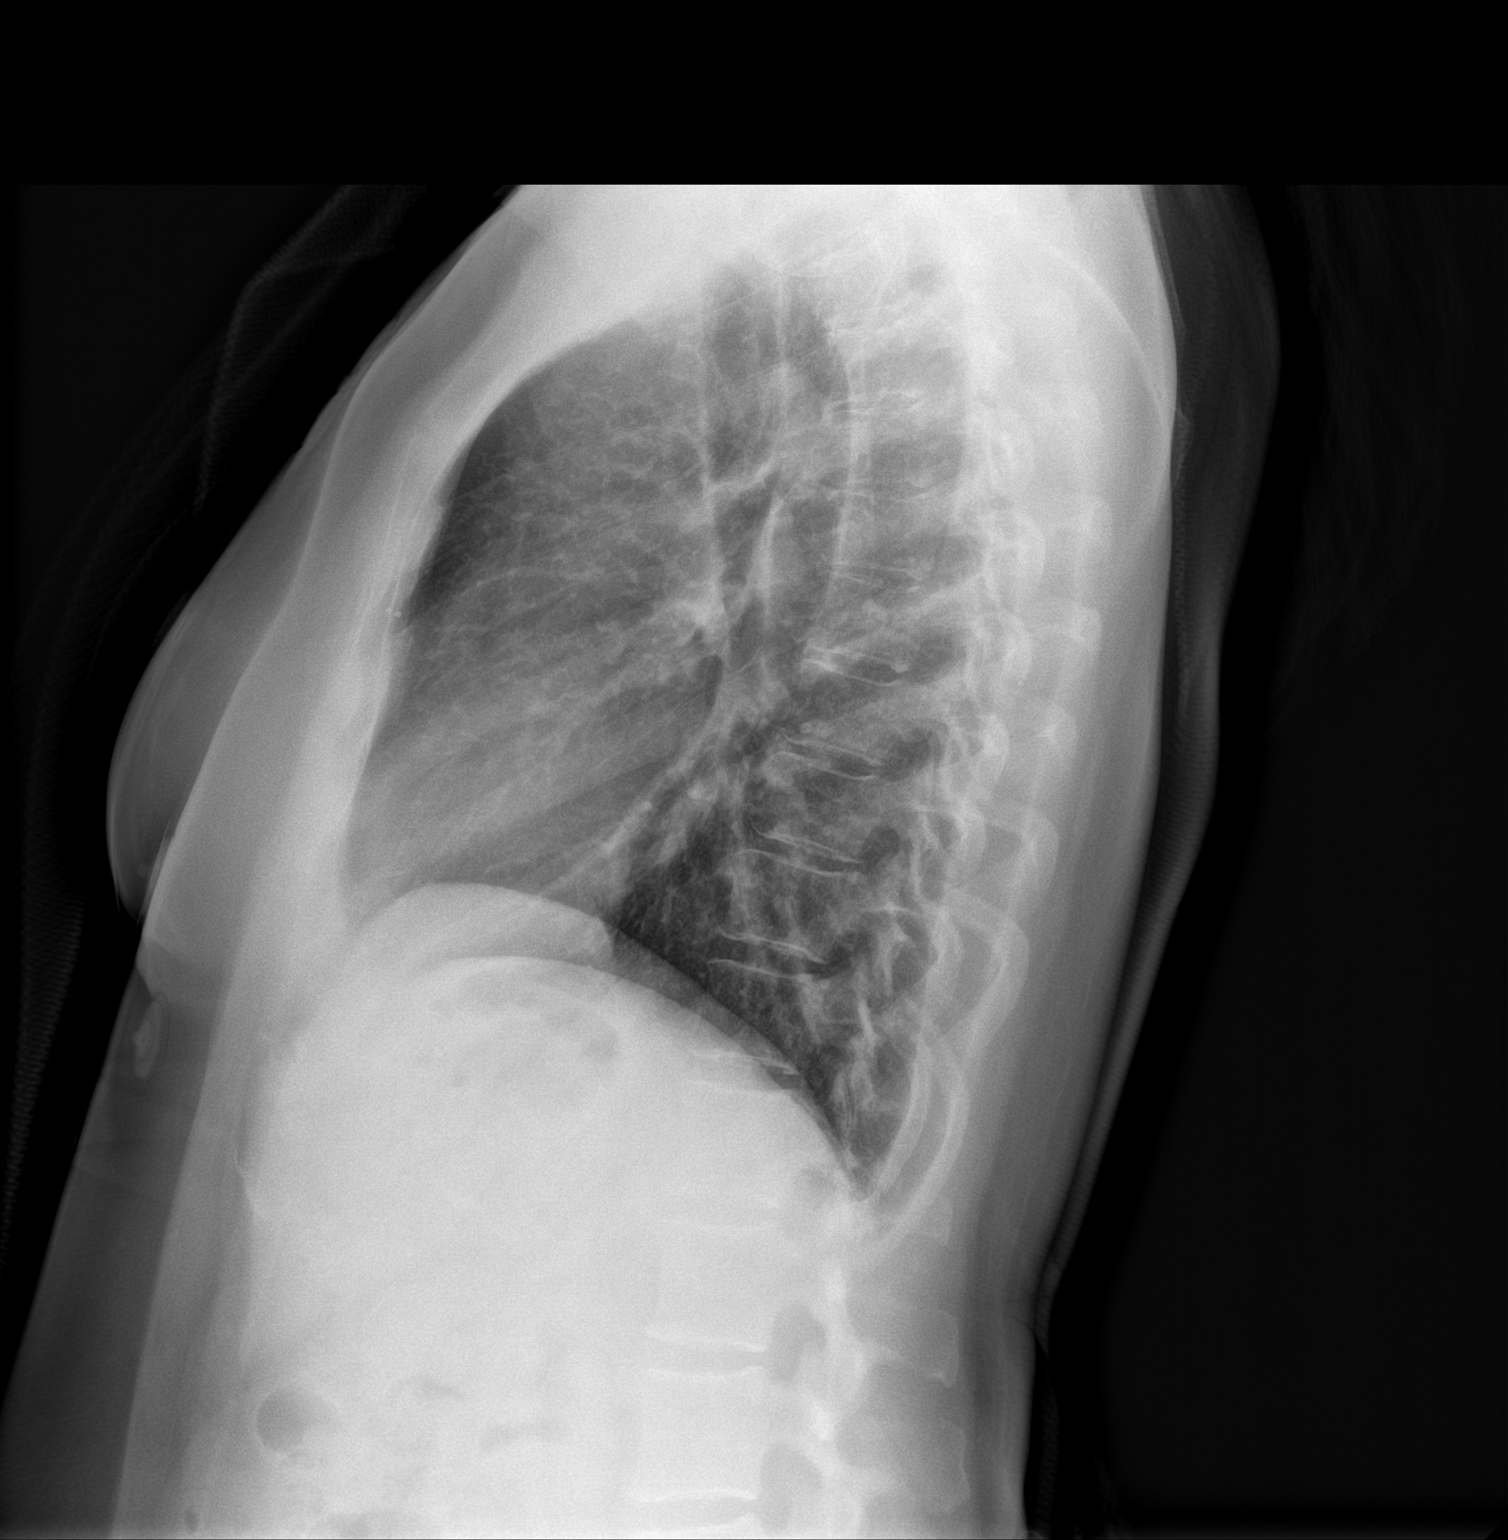

[2 of 2 positions shown; findings below may reference images not displayed]

FINDINGS: Normal heart size, mediastinal contours, and pulmonary vascularity.

Lungs clear.

No pleural effusion or pneumothorax.

Bones unremarkable.
IMPRESSION: Normal exam.

## 2023-05-22 DIAGNOSIS — M25572 Pain in left ankle and joints of left foot: Secondary | ICD-10-CM | POA: Diagnosis not present

## 2023-05-30 DIAGNOSIS — S86812A Strain of other muscle(s) and tendon(s) at lower leg level, left leg, initial encounter: Secondary | ICD-10-CM | POA: Diagnosis not present

## 2023-06-16 ENCOUNTER — Other Ambulatory Visit (HOSPITAL_BASED_OUTPATIENT_CLINIC_OR_DEPARTMENT_OTHER): Payer: Self-pay

## 2023-06-16 MED ORDER — FLULAVAL 0.5 ML IM SUSY
0.5000 mL | PREFILLED_SYRINGE | Freq: Once | INTRAMUSCULAR | 0 refills | Status: AC
  Filled 2023-06-16: qty 0.5, 1d supply, fill #0

## 2023-06-18 IMAGING — CT CT HEART MORP W/ CTA COR W/ SCORE W/ CA W/CM &/OR W/O CM
4 of 7 series · 8 of 20 positions shown, 9 images · IV contrast (APPLIED)
Comparison: None.

Addendum:
CLINICAL DATA: Chest pain

EXAM:
Cardiac/Coronary CTA
TECHNIQUE: A non-contrast, gated CT scan was obtained with axial slices of 3 mm
through the heart for calcium scoring. Calcium scoring was performed
using the Agatston method. A 120 kV prospective, gated, contrast
cardiac scan was obtained. Gantry rotation speed was 250 msecs and
collimation was 0.6 mm. Two sublingual nitroglycerin tablets (0.8
mg) were given. The 3D data set was reconstructed in 5% intervals of
the 35-75% of the R-R cycle. Diastolic phases were analyzed on a
dedicated workstation using MPR, MIP, and VRT modes. The patient
received 95 cc of contrast.

[Series 6: best syst · axial · 0.39mm/px · z∈[-108,-71]mm · 2 of 281 slices shown, 3 images]
[im 94/281  vessel]
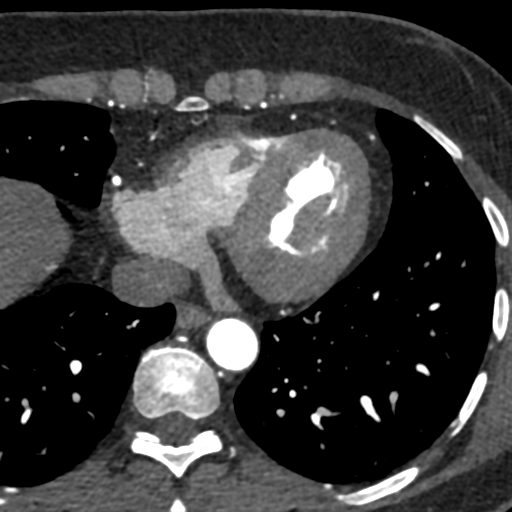
[im 94/281  lung]
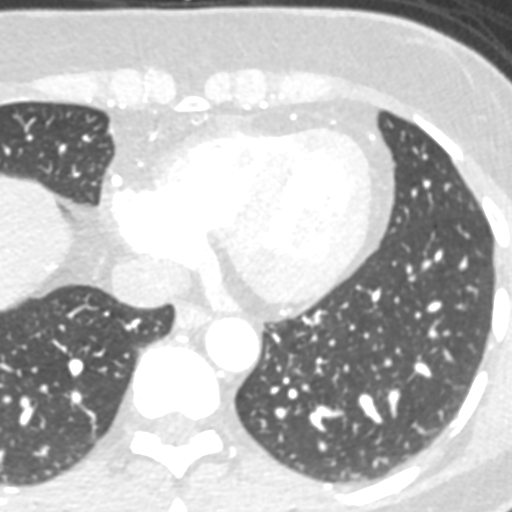
[im 187/281  vessel]
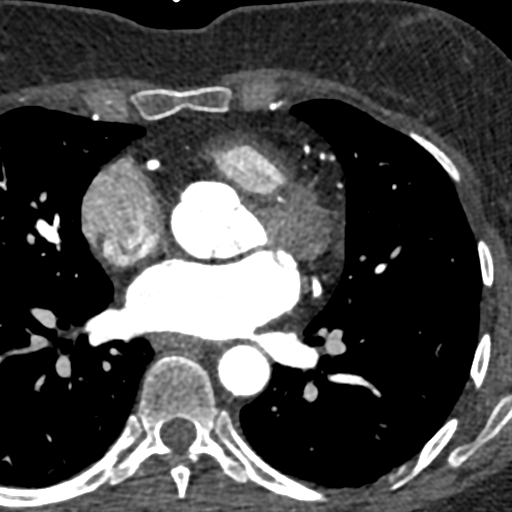

[Series 7: best diast · axial · 0.39mm/px · z∈[-108,-70]mm · 2 of 279 slices shown]
[im 93/279  vessel]
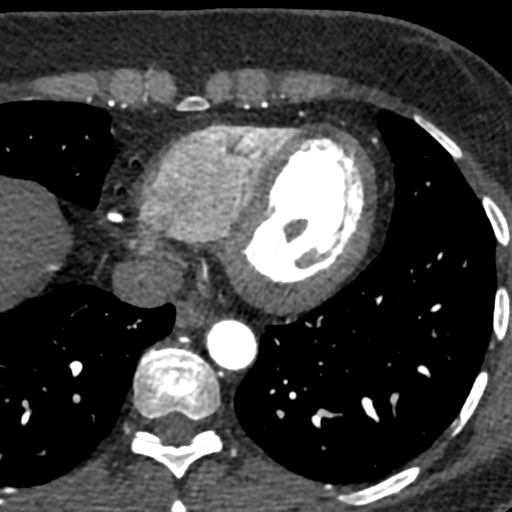
[im 186/279  vessel]
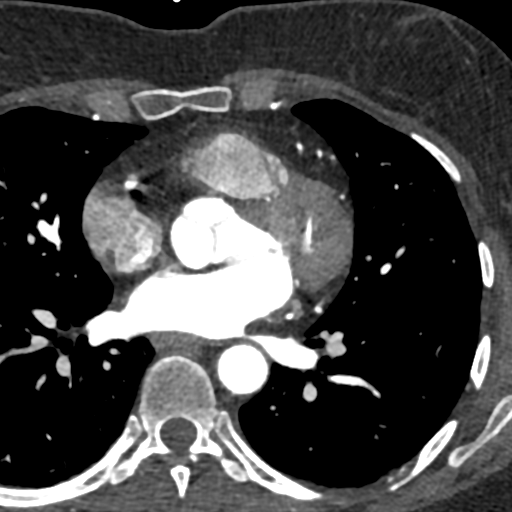

[Series 8: ts diast · axial · 0.39mm/px · z∈[-108,-71]mm · 2 of 278 slices shown]
[im 93/278  vessel]
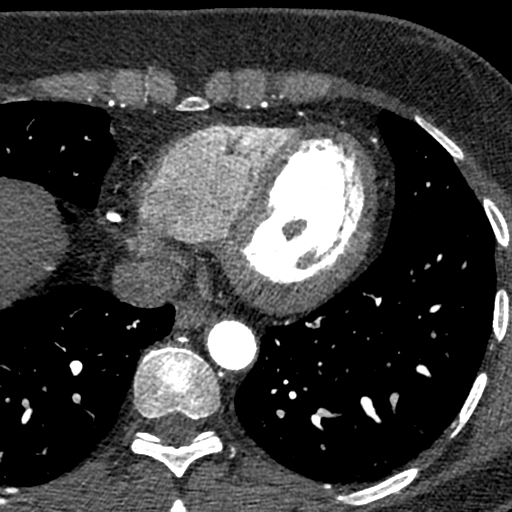
[im 185/278  vessel]
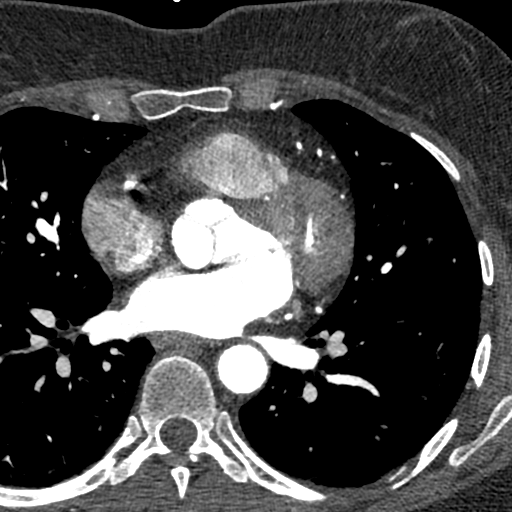

[Series 9: ts syst · axial · 0.39mm/px · z∈[-108,-71]mm · 2 of 281 slices shown]
[im 94/281  vessel]
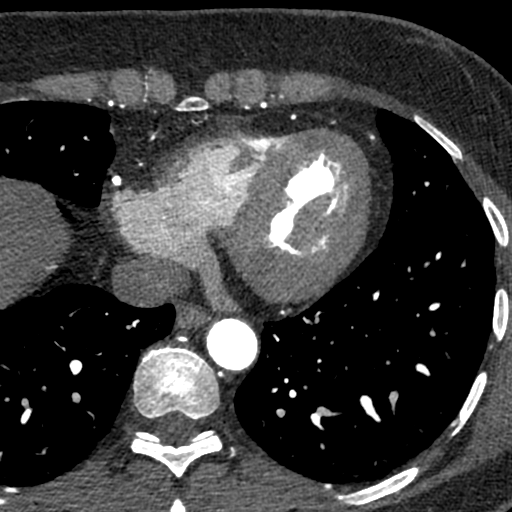
[im 187/281  vessel]
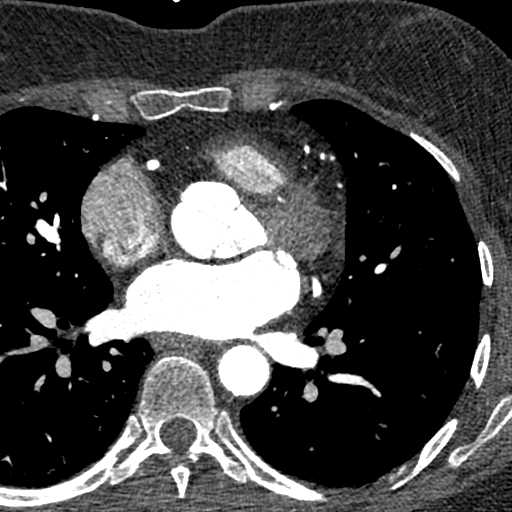

[8 of 20 positions shown; findings below may reference images not displayed]

FINDINGS: Image quality: Excellent.

Noise artifact is: Limited.

Coronary Arteries:  Normal coronary origin.  Right dominance.

Left main: The left main is a large caliber vessel with a normal
take off from the left coronary cusp that bifurcates to form a left
anterior descending artery and a left circumflex artery. There is no
plaque or stenosis.

Left anterior descending artery: The LAD is patent without evidence
of plaque or stenosis. The LAD gives off 2 patent diagonal branches.

Left circumflex artery: The LCX is non-dominant and patent with no
evidence of plaque or stenosis. The LCX gives off 1 patent obtuse
marginal branch.

Right coronary artery: The RCA is dominant with normal take off from
the right coronary cusp. There is no evidence of plaque or stenosis.
The RCA terminates as a PDA and right posterolateral branch without
evidence of plaque or stenosis.

Right Atrium: Right atrial size is within normal limits.

Right Ventricle: The right ventricular cavity is within normal
limits.

Left Atrium: Left atrial size is normal in size with no left atrial
appendage filling defect.

Left Ventricle: The ventricular cavity size is within normal limits.
There are no stigmata of prior infarction. There is no abnormal
filling defect.

Pulmonary arteries: Normal in size without proximal filling defect.

Pulmonary veins: Normal pulmonary venous drainage.

Pericardium: Normal thickness with no significant effusion or
calcium present.

Cardiac valves: The aortic valve is trileaflet without significant
calcification. The mitral valve is normal structure without
significant calcification.

Aorta: Normal caliber with no significant disease.

Extra-cardiac findings: See attached radiology report for
non-cardiac structures.
IMPRESSION: 1. Coronary calcium score of 0. This was 0 percentile for age-, sex,
and race-matched controls.

2. Normal coronary origin with right dominance.

3. Normal coronary arteries.  CAD RADS 0.

4.  Consider non atherosclerotic causes of chest pain.

RECOMMENDATIONS:
1. CAD-RADS 0: No evidence of CAD (0%). Consider non-atherosclerotic
causes of chest pain.

2. CAD-RADS 1: Minimal non-obstructive CAD (0-24%). Consider
non-atherosclerotic causes of chest pain. Consider preventive
therapy and risk factor modification.

3. CAD-RADS 2: Mild non-obstructive CAD (25-49%). Consider
non-atherosclerotic causes of chest pain. Consider preventive
therapy and risk factor modification.

4. CAD-RADS 3: Moderate stenosis. Consider symptom-guided
anti-ischemic pharmacotherapy as well as risk factor modification
per guideline directed care. Additional analysis with CT FFR will be
submitted.

5. CAD-RADS 4: Severe stenosis. (70-99% or > 50% left main). Cardiac
catheterization or CT FFR is recommended. Consider symptom-guided
anti-ischemic pharmacotherapy as well as risk factor modification
per guideline directed care. Invasive coronary angiography
recommended with revascularization per published guideline
statements.

6. CAD-RADS 5: Total coronary occlusion (100%). Consider cardiac
catheterization or viability assessment. Consider symptom-guided
anti-ischemic pharmacotherapy as well as risk factor modification
per guideline directed care.

7. CAD-RADS N: Non-diagnostic study. Obstructive CAD can't be
excluded. Alternative evaluation is recommended.

EXAM:
OVER-READ INTERPRETATION  CT CHEST

The following report is an over-read performed by radiologist Dr.
over-read does not include interpretation of cardiac or coronary
anatomy or pathology. The coronary calcium score and cardiac CTA
interpretation by the cardiologist is attached.
FINDINGS: Within the visualized portions of the thorax there are no suspicious
appearing pulmonary nodules or masses, there is no acute
consolidative airspace disease, no pleural effusions, no
pneumothorax and no lymphadenopathy. Visualized portions of the
upper abdomen are unremarkable. There are no aggressive appearing
lytic or blastic lesions noted in the visualized portions of the
skeleton.
IMPRESSION: 1. No significant incidental noncardiac findings are noted.

*** End of Addendum ***
FINDINGS: Image quality: Excellent.

Noise artifact is: Limited.

Coronary Arteries:  Normal coronary origin.  Right dominance.

Left main: The left main is a large caliber vessel with a normal
take off from the left coronary cusp that bifurcates to form a left
anterior descending artery and a left circumflex artery. There is no
plaque or stenosis.

Left anterior descending artery: The LAD is patent without evidence
of plaque or stenosis. The LAD gives off 2 patent diagonal branches.

Left circumflex artery: The LCX is non-dominant and patent with no
evidence of plaque or stenosis. The LCX gives off 1 patent obtuse
marginal branch.

Right coronary artery: The RCA is dominant with normal take off from
the right coronary cusp. There is no evidence of plaque or stenosis.
The RCA terminates as a PDA and right posterolateral branch without
evidence of plaque or stenosis.

Right Atrium: Right atrial size is within normal limits.

Right Ventricle: The right ventricular cavity is within normal
limits.

Left Atrium: Left atrial size is normal in size with no left atrial
appendage filling defect.

Left Ventricle: The ventricular cavity size is within normal limits.
There are no stigmata of prior infarction. There is no abnormal
filling defect.

Pulmonary arteries: Normal in size without proximal filling defect.

Pulmonary veins: Normal pulmonary venous drainage.

Pericardium: Normal thickness with no significant effusion or
calcium present.

Cardiac valves: The aortic valve is trileaflet without significant
calcification. The mitral valve is normal structure without
significant calcification.

Aorta: Normal caliber with no significant disease.

Extra-cardiac findings: See attached radiology report for
non-cardiac structures.
IMPRESSION: 1. Coronary calcium score of 0. This was 0 percentile for age-, sex,
and race-matched controls.

2. Normal coronary origin with right dominance.

3. Normal coronary arteries.  CAD RADS 0.

4.  Consider non atherosclerotic causes of chest pain.

RECOMMENDATIONS:
1. CAD-RADS 0: No evidence of CAD (0%). Consider non-atherosclerotic
causes of chest pain.

2. CAD-RADS 1: Minimal non-obstructive CAD (0-24%). Consider
non-atherosclerotic causes of chest pain. Consider preventive
therapy and risk factor modification.

3. CAD-RADS 2: Mild non-obstructive CAD (25-49%). Consider
non-atherosclerotic causes of chest pain. Consider preventive
therapy and risk factor modification.

4. CAD-RADS 3: Moderate stenosis. Consider symptom-guided
anti-ischemic pharmacotherapy as well as risk factor modification
per guideline directed care. Additional analysis with CT FFR will be
submitted.

5. CAD-RADS 4: Severe stenosis. (70-99% or > 50% left main). Cardiac
catheterization or CT FFR is recommended. Consider symptom-guided
anti-ischemic pharmacotherapy as well as risk factor modification
per guideline directed care. Invasive coronary angiography
recommended with revascularization per published guideline
statements.

6. CAD-RADS 5: Total coronary occlusion (100%). Consider cardiac
catheterization or viability assessment. Consider symptom-guided
anti-ischemic pharmacotherapy as well as risk factor modification
per guideline directed care.

7. CAD-RADS N: Non-diagnostic study. Obstructive CAD can't be
excluded. Alternative evaluation is recommended.

## 2023-07-17 DIAGNOSIS — Z1231 Encounter for screening mammogram for malignant neoplasm of breast: Secondary | ICD-10-CM | POA: Diagnosis not present

## 2023-07-17 DIAGNOSIS — Z6828 Body mass index (BMI) 28.0-28.9, adult: Secondary | ICD-10-CM | POA: Diagnosis not present

## 2023-07-17 DIAGNOSIS — Z01419 Encounter for gynecological examination (general) (routine) without abnormal findings: Secondary | ICD-10-CM | POA: Diagnosis not present

## 2023-07-22 DIAGNOSIS — E785 Hyperlipidemia, unspecified: Secondary | ICD-10-CM | POA: Diagnosis not present

## 2023-07-22 DIAGNOSIS — Z1321 Encounter for screening for nutritional disorder: Secondary | ICD-10-CM | POA: Diagnosis not present

## 2023-07-22 DIAGNOSIS — Z1329 Encounter for screening for other suspected endocrine disorder: Secondary | ICD-10-CM | POA: Diagnosis not present

## 2023-07-22 DIAGNOSIS — Z13228 Encounter for screening for other metabolic disorders: Secondary | ICD-10-CM | POA: Diagnosis not present

## 2023-07-22 DIAGNOSIS — Z130 Encounter for screening for diseases of the blood and blood-forming organs and certain disorders involving the immune mechanism: Secondary | ICD-10-CM | POA: Diagnosis not present

## 2023-07-22 DIAGNOSIS — Z131 Encounter for screening for diabetes mellitus: Secondary | ICD-10-CM | POA: Diagnosis not present

## 2023-09-17 ENCOUNTER — Other Ambulatory Visit: Payer: Self-pay | Admitting: Neurology

## 2023-10-07 DIAGNOSIS — Z1211 Encounter for screening for malignant neoplasm of colon: Secondary | ICD-10-CM | POA: Diagnosis not present

## 2023-10-07 DIAGNOSIS — K625 Hemorrhage of anus and rectum: Secondary | ICD-10-CM | POA: Diagnosis not present

## 2023-10-07 DIAGNOSIS — K219 Gastro-esophageal reflux disease without esophagitis: Secondary | ICD-10-CM | POA: Diagnosis not present

## 2023-10-07 DIAGNOSIS — K5900 Constipation, unspecified: Secondary | ICD-10-CM | POA: Diagnosis not present

## 2023-11-01 HISTORY — PX: COLONOSCOPY: SHX174

## 2023-11-13 ENCOUNTER — Other Ambulatory Visit: Payer: Self-pay | Admitting: Neurology

## 2023-11-28 DIAGNOSIS — D122 Benign neoplasm of ascending colon: Secondary | ICD-10-CM | POA: Diagnosis not present

## 2023-11-28 DIAGNOSIS — K648 Other hemorrhoids: Secondary | ICD-10-CM | POA: Diagnosis not present

## 2023-11-28 DIAGNOSIS — Z1211 Encounter for screening for malignant neoplasm of colon: Secondary | ICD-10-CM | POA: Diagnosis not present

## 2023-11-28 DIAGNOSIS — K635 Polyp of colon: Secondary | ICD-10-CM | POA: Diagnosis not present

## 2023-12-29 DIAGNOSIS — H02402 Unspecified ptosis of left eyelid: Secondary | ICD-10-CM | POA: Diagnosis not present

## 2023-12-29 DIAGNOSIS — H53001 Unspecified amblyopia, right eye: Secondary | ICD-10-CM | POA: Diagnosis not present

## 2024-01-06 ENCOUNTER — Other Ambulatory Visit: Payer: Self-pay | Admitting: Neurology

## 2024-01-27 ENCOUNTER — Telehealth: Payer: Self-pay | Admitting: Neurology

## 2024-01-27 NOTE — Telephone Encounter (Signed)
 Appointment made

## 2024-01-28 DIAGNOSIS — M79644 Pain in right finger(s): Secondary | ICD-10-CM | POA: Diagnosis not present

## 2024-03-02 ENCOUNTER — Other Ambulatory Visit: Payer: Self-pay | Admitting: Neurology

## 2024-04-30 ENCOUNTER — Other Ambulatory Visit: Payer: Self-pay | Admitting: Neurology

## 2024-04-30 NOTE — Telephone Encounter (Signed)
 Last seen on 02/18/23 Follow up scheduled on 05/10/24

## 2024-05-10 ENCOUNTER — Encounter: Payer: Self-pay | Admitting: Neurology

## 2024-05-10 ENCOUNTER — Ambulatory Visit (INDEPENDENT_AMBULATORY_CARE_PROVIDER_SITE_OTHER): Payer: Self-pay | Admitting: Neurology

## 2024-05-10 VITALS — BP 108/58 | HR 71 | Ht 64.0 in | Wt 161.0 lb

## 2024-05-10 DIAGNOSIS — G43711 Chronic migraine without aura, intractable, with status migrainosus: Secondary | ICD-10-CM

## 2024-05-10 DIAGNOSIS — G43101 Migraine with aura, not intractable, with status migrainosus: Secondary | ICD-10-CM

## 2024-05-10 MED ORDER — QULIPTA 60 MG PO TABS: 60.0000 mg | ORAL_TABLET | Freq: Every day | ORAL | 11 refills | Status: DC

## 2024-05-10 MED ORDER — NURTEC 75 MG PO TBDP: 75.0000 mg | ORAL_TABLET | Freq: Every day | ORAL | 0 refills | Status: DC | PRN

## 2024-05-10 MED ORDER — PROMETHAZINE HCL 12.5 MG PO TABS: 12.5000 mg | ORAL_TABLET | Freq: Four times a day (QID) | ORAL | 11 refills | Status: AC | PRN

## 2024-05-10 NOTE — Progress Notes (Unsigned)
 [facility]    Provider:  Dr Ines Requesting Provider: No ref. provider found Primary Care Provider:  Patient, No Pcp Per  CC: migraines  05/10/2024: She has daily headaches and 20 migraine days a month. Start Qulipta .   HPI:  Bonnie Lester is a 53 y.o. female here as requested by No ref. provider found for migraines. She has a PMHx of parotid mass, migraines, menopause, I reviewed Dr. German notes, she contonues to have migraines despite treating with imitrex , nurtec, ubrelvy. And she reports an aura. She feels hopeless. She has acid reflux, no snoring, wake up refreshed, no napping during the day. Start in the front unilaterally, pulsating/pounding/throbbing, photo/phonophobia, smells can trigger and phantom smells , nausea, vomiting, 4-72 hours and are moderate to severe she can be in bed for 3-4 days. No medication overuse. Over last 6 months, daily headaches and 8 severe migraines days a month and > 12 moderate to severe migraine days a month. Aura is vert rare all migraines are without aura. Nurtec every other day has helped but that causes constipation as well. [place] made her worse. Imitrex  works. She breaks it in half with an alleve.   Reviewed notes, labs and imaging from outside physicians, which showed:   From a thorough review of records, medications tried that can be used in migraine or headache management include:Current and past medications: ANALGESICS: Oxycodone, Excedrin, Tylenol  ANTI-MIGRAINE: Imitrex  pill and injection(works with alleve), Treximet, Zomig, Ubrelvy, Nurtec HEART/BP: Beta blockers avoided due to low bp DECONGESTANT/ANTIHISTAMINE: Allegra, Benadryl, Flonase, Sudafed,  Zyrtec ANTI-NAUSEANT: Phenergan , Zofran  NSAIDS: Ibuprofen , naproxen MUSCLE RELAXANTS: Flexeril  ANTI-CONVULSANTS: Topamax (Shortness of breath, concentration issues) STEROIDS: SLEEPING PILLS/TRANQUILIZERS: ANTI-DEPRESSANTS: Zoloft, amitriptyline HERBAL: Co-Q10,  magnesium, B2 FIBROMYALGIA:  HORMONAL: OTHER: Emgality-severe constipation, aimovig is contraindicated due to severe constipation. Ajovy not effective PROCEDURES FOR HEADACHES: Botox -she did not like the injections     HPI This is a chronic problem. Episode onset: childhood. The problem occurs intermittently. The problem has been waxing and waning. The pain is located in the frontal and left unilateral region. The quality of the pain is described as dull, sharp, stabbing and throbbing. The pain is severe. Associated symptoms include blurred vision, eye pain, insomnia, nausea, phonophobia, photophobia, sinus pressure and a visual change. The symptoms are aggravated by activity, alcohol, bright light, caffeine withdrawal, fatigue, food, menstrual cycle, MSG, noise and weather changes.  History of rare aura in the past.  Most are visual with lasting about an hour. Headaches last 2-3 days.  Recently more auras which include losing half of her vision. Headaches always follows.  She has a family history, grandmother and mother, of migraines.   Seen today in recheck of headaches. At last visit, she received her first Botox  injection. This has been over a year ago. She felt that Botox  just was not for he and never returned for a second injection cycle. In November 2022, she restarted Topamax and titrated it up to 100 mg nightly. She has not suffered from the same symptoms she had previously. Her headaches had been doing well up until this past month when she reportedly has had some sinus issues.   She takes Imitrex  as needed for severe headaches. She had been trialed on Nurtec and felt that it helped but had been unable to get it. It now looks like it is on her insurance formulary and we will reorder it. She will continue on cyclobenzaprine  as needed for milder headaches.    She exercises every day and  has taken Zyrtec at night which helps her sleep. She had a breast reduction which helped with some of her  neck pain.   Current and past medications: ANALGESICS: Oxycodone, Excedrin, Tylenol  ANTI-MIGRAINE: Imitrex  pill and injection, Treximet, Zomig, Ubrelvy, Nurtec HEART/BP: Beta blockers avoided due to low bp DECONGESTANT/ANTIHISTAMINE: Allegra, Benadryl, Flonase, Sudafed,  Zyrtec ANTI-NAUSEANT: Phenergan , Zofran  NSAIDS: Ibuprofen , naproxen MUSCLE RELAXANTS: Flexeril  ANTI-CONVULSANTS: Topamax (Shortness of breath, concentration issues) STEROIDS: SLEEPING PILLS/TRANQUILIZERS: ANTI-DEPRESSANTS: Zoloft, amitriptyline HERBAL: Co-Q10, magnesium, B2 FIBROMYALGIA:  HORMONAL: OTHER: Emgality-severe constipation PROCEDURES FOR HEADACHES: Botox -she did not like the injections   Review of Systems: Patient complains of symptoms per HPI as well as the following symptoms migraine. Pertinent negatives and positives per HPI. All others negative.   Social History   Socioeconomic History   Marital status: Married    Spouse name: Not on file   Number of children: 2   Years of education: Not on file   Highest education level: Not on file  Occupational History   Occupation: Firefighter  Tobacco Use   Smoking status: Never    Passive exposure: Past   Smokeless tobacco: Never  Vaping Use   Vaping status: Never Used  Substance and Sexual Activity   Alcohol use: Not Currently    Comment: rarely   Drug use: No   Sexual activity: Not on file  Other Topics Concern   Not on file  Social History Narrative   Caffeine: 1 cup/day coffee   Right handed   Social Drivers of Corporate investment banker Strain: Not on file  Food Insecurity: No Food Insecurity (10/02/2021)   Received from [facility]   Hunger Vital Sign    Within the past 12 months, you worried that your food would run out before you got the money to buy more.: Never true    Within the past 12 months, the food you bought just didn't last and you didn't have money to get more.: Never true  Transportation Needs: Not on file   Physical Activity: Not on file  Stress: Not on file  Social Connections: Unknown (01/11/2022)   [facility] from [facility]   Social Network    Social Network: Not on file  Intimate Partner Violence: Unknown (12/03/2021)   Received from [facility]   HITS    Physically Hurt: Not on file    Insult or Talk Down To: Not on file    Threaten Physical Harm: Not on file    Scream or Curse: Not on file    Family History  Problem Relation Age of Onset   Diabetes Mother    Migraines Mother    Depression Father    Depression Sister    Heart attack Sister        Cocaine abuse   Cancer Maternal Grandmother     Past Medical History:  Diagnosis Date   Deviated septum    GERD (gastroesophageal reflux disease)    Migraines    guaranteed once/month; sporatic from there (07/17/2016)   Parotid tumor    PONV (postoperative nausea and vomiting)    Prediabetes    Seasonal asthma     Patient Active Problem List   Diagnosis Date Noted   Parotid mass 07/17/2016    Past Surgical History:  Procedure Laterality Date   BREAST REDUCTION SURGERY     COLONOSCOPY  11/2023   ENDOMETRIAL ABLATION  2009   HAND SURGERY Left 2004   nerve repair   INGUINAL HERNIA REPAIR Right 1999  PAROTIDECTOMY Left 07/16/2016   WITH FACIAL NERVE DISSECTION AND NIMS MONITORING    PAROTIDECTOMY Left 07/17/2016   Procedure: LEFT PAROTIDECTOMY WITH FACIAL NERVE DISSECTION AND NIMS MONITORING;  Surgeon: Alm Bouche, MD;  Location: [facility] OR;  Service: ENT;  Laterality: Left;   TUBAL LIGATION  2001    Current Outpatient Medications  Medication Sig Dispense Refill   albuterol  (VENTOLIN  HFA) 108 (90 Base) MCG/ACT inhaler Inhale 2 puffs into the lungs every 6 (six) hours as needed for wheezing or shortness of breath. Inhale two puffs every four to six hours as needed for cough or wheeze. 1 Inhaler 0   ALPRAZolam  (XANAX ) 0.25 MG tablet Take 1-2 tabs (0.25mg -0.50mg ) 30-60 minutes before procedure. May repeat if  needed.Do not drive. Otherwise can take 1-2 doses up to 4 times a day sparingly. 30 tablet 0   Atogepant  (QULIPTA ) 60 MG TABS Take 1 tablet (60 mg total) by mouth daily. Please run copay card: BIN [id number] PCN CNRX GRP [id number] ID [id number] EXP 09/01/2024 30 tablet 11   Cetirizine HCl (ZYRTEC ALLERGY) 10 MG CAPS Take by oral route as needed.     cyclobenzaprine  (FLEXERIL ) 10 MG tablet Take 10 mg by mouth 3 (three) times daily as needed.     Estradiol-Norethindrone Acet (MIMVEY PO) Take 1 tablet by mouth daily.     famotidine  (PEPCID ) 20 MG tablet Take 1 tablet (20 mg total) by mouth 2 (two) times daily. (Patient taking differently: Take 20 mg by mouth 2 (two) times daily. As needed) 30 tablet 0   Lidocaine  (ZTLIDO ) 1.8 % PTCH Apply 2 patches topically daily. 8 patch 0   MAGNESIUM PO Take 200 mg by mouth daily.     naproxen sodium (ALEVE) 220 MG tablet Take 1 tablet by mouth as needed.     promethazine  (PHENERGAN ) 12.5 MG tablet Take 1-2 tablets (12.5-25 mg total) by mouth every 6 (six) hours as needed for nausea or vomiting. 30 tablet 11   riboflavin (VITAMIN B-2) 100 MG TABS tablet Take 1 tablet by mouth daily.     Rimegepant Sulfate (NURTEC) 75 MG TBDP Take 1 tablet (75 mg total) by mouth daily as needed. For migraines. Take as close to onset of migraine as possible. One daily maximum. 16 tablet 0   semaglutide-weight management (WEGOVY) 0.25 MG/0.5ML SOAJ SQ injection Inject 0.25 mg into the skin once a week.     SUMAtriptan  (IMITREX ) 100 MG tablet TAKE 1 TABLET BY MOUTH TWICE A DAY AS NEEDED FOR MIGRAINE MAY REPEAT IN 2 HOURS IF HEADACHE PERSITS OR RECURS 12 tablet 0   No current facility-administered medications for this visit.    Allergies as of 05/10/2024 - Review Complete 05/10/2024  Allergen Reaction Noted   Epinephrine  Nausea Only and Palpitations 04/13/2021   Penicillins Other (See Comments) 07/09/2016    Vitals: BP (!) 108/58 (BP Location: Right Arm, Patient Position: Sitting,  Cuff Size: Normal)   Pulse 71   Ht 5' 4 (1.626 m)   Wt 161 lb (73 kg)   BMI 27.64 kg/m  Last Weight:  Wt Readings from Last 1 Encounters:  05/10/24 161 lb (73 kg)   Last Height:   Ht Readings from Last 1 Encounters:  05/10/24 5' 4 (1.626 m)     Physical exam: Exam: Gen: NAD, conversant, well nourised, well groomed                     CV: RRR, no MRG. No Carotid Bruits. No peripheral edema,  warm, nontender Eyes: Conjunctivae clear without exudates or hemorrhage  Neuro: Detailed Neurologic Exam  Speech:    Speech is normal; fluent and spontaneous with normal comprehension.  Cognition:    The patient is oriented to person, place, and time;     recent and remote memory intact;     language fluent;     normal attention, concentration,     fund of knowledge Cranial Nerves:    The pupils are equal, round, and reactive to light. The fundi are normal and spontaneous venous pulsations are present. Visual fields are full to finger confrontation. Extraocular movements are intact. Trigeminal sensation is intact and the muscles of mastication are normal. The face is symmetric. The palate elevates in the midline. Hearing intact. Voice is normal. Shoulder shrug is normal. The tongue has normal motion without fasciculations.   Coordination:    Normal finger to nose and heel to shin. Normal rapid alternating movements.   Gait:    Heel-toe and tandem gait are normal.   Motor Observation:    No asymmetry, no atrophy, and no involuntary movements noted. Tone:    Normal muscle tone.    Posture:    Posture is normal. normal erect    Strength:    Strength is V/V in the upper and lower limbs.      Sensation: intact to LT     Reflex Exam:  DTR's:    Deep tendon reflexes in the upper and lower extremities are normal bilaterally.   Toes:    The toes are downgoing bilaterally.   Clonus:    Clonus is absent.    Assessment/Plan:  Patient with chronic migraines, failed plethora of  meds, start botox  (when she had botox  got > 50% relief but hated the needles we will try again and be more gentle)  Be gentle on botox  had bad experience Will give xanax  prn prior to botox  Lidocaine  4% prior to office botox  on face and anywhere you want for botox  MRI of the brain - will call Ztildo for traps Lexapro  for depression  No orders of the defined types were placed in this encounter.  Meds ordered this encounter  Medications   Rimegepant Sulfate (NURTEC) 75 MG TBDP    Sig: Take 1 tablet (75 mg total) by mouth daily as needed. For migraines. Take as close to onset of migraine as possible. One daily maximum.    Dispense:  16 tablet    Refill:  0   Atogepant  (QULIPTA ) 60 MG TABS    Sig: Take 1 tablet (60 mg total) by mouth daily. Please run copay card: BIN [id number] PCN CNRX GRP [id number] ID [id number] EXP 09/01/2024    Dispense:  30 tablet    Refill:  11    Please run copay card: BIN [id number] PCN CNRX GRP [id number] ID [id number] EXP 09/01/2024   promethazine  (PHENERGAN ) 12.5 MG tablet    Sig: Take 1-2 tablets (12.5-25 mg total) by mouth every 6 (six) hours as needed for nausea or vomiting.    Dispense:  30 tablet    Refill:  11   Discussed: To prevent or relieve headaches, try the following: Cool Compress. Lie down and place a cool compress on your head.  Avoid headache triggers. If certain foods or odors seem to have triggered your migraines in the past, avoid them. A headache diary might help you identify triggers.  Include physical activity in your daily routine. Try a daily walk or other moderate aerobic exercise.  Manage stress.  Find healthy ways to cope with the stressors, such as delegating tasks on your to-do list.  Practice relaxation techniques. Try deep breathing, yoga, massage and visualization.  Eat regularly. Eating regularly scheduled meals and maintaining a healthy diet might help prevent headaches. Also, drink plenty of fluids.  Follow a regular sleep  schedule. Sleep deprivation might contribute to headaches Consider biofeedback. With this mind-body technique, you learn to control certain bodily functions -- such as muscle tension, heart rate and blood pressure -- to prevent headaches or reduce headache pain.    Proceed to emergency room if you experience new or worsening symptoms or symptoms do not resolve, if you have new neurologic symptoms or if headache is severe, or for any concerning symptom.   Provided education and documentation from American headache Society toolbox including articles on: chronic migraine medication overuse headache, chronic migraines, prevention of migraines, behavioral and other nonpharmacologic treatments for headache.   Cc: No ref. provider found,  Patient, No Pcp Per  Onetha Epp, MD  [facility] [address] [place], [place] [address]  Phone [phone] Fax [phone]  I spent 60 minutes of face-to-face and non-face-to-face time with patient on the  1. Chronic migraine without aura, with intractable migraine, so stated, with status migrainosus     diagnosis.  This included previsit chart review, lab review, study review, order entry, electronic health record documentation, patient education on the different diagnostic and therapeutic options, counseling and coordination of care, risks and benefits of management, compliance, or risk factor reduction

## 2024-05-10 NOTE — Patient Instructions (Addendum)
 Options:  Start Qulipta  prevention Nurtec as needed daily  Light sensitivity: FL-41 filter - glasses  Discussed:  There is increased risk for stroke in women with migraine with aura and a contraindication for the combined contraceptive pill for use by women who have migraine with aura. The risk for women with migraine without aura is lower. However other risk factors like smoking are far more likely to increase stroke risk than migraine. There is a recommendation for no smoking and for the use of OCPs without estrogen such as progestogen only pills particularly for women with migraine with aura.SABRA People who have migraine headaches with auras may be 3 times more likely to have a stroke caused by a blood clot, compared to migraine patients who don't see auras. Women who take hormone-replacement therapy may be 30 percent more likely to suffer a clot-based stroke than women not taking medication containing estrogen. Other risk factors like smoking and high blood pressure may be  much more important. And stroke is still a rare complication due to migraine aura and is controversial and lower doses may not cause a risk.    Atogepant  Tablets What is this medication? ATOGEPANT  (a TOE je pant) prevents migraines. It works by blocking a substance in the body that causes migraines. This medicine may be used for other purposes; ask your health care provider or pharmacist if you have questions. COMMON BRAND NAME(S): QULIPTA  What should I tell my care team before I take this medication? They need to know if you have any of these conditions: Circulation problems in fingers or toes (Raynaud syndrome) High blood pressure Kidney disease Liver disease An unusual or allergic reaction to atogepant , other medications, foods, dyes, or preservatives Pregnant or trying to get pregnant Breastfeeding How should I use this medication? Take this medication by mouth with water. Take it as directed on the prescription  label at the same time every day. You can take it with or without food. If it upsets your stomach, take it with food. Keep taking it unless your care team tells you to stop. Talk to your care team about the use of this medication in children. Special care may be needed. Overdosage: If you think you have taken too much of this medicine contact a poison control center or emergency room at once. NOTE: This medicine is only for you. Do not share this medicine with others. What if I miss a dose? If you miss a dose, take it as soon as you can. If it is almost time for your next dose, take only that dose. Do not take double or extra doses. What may interact with this medication? Carbamazepine Certain medications for fungal infections, such as itraconazole, ketoconazole Clarithromycin Cyclosporine Efavirenz Etravirine Phenytoin Rifampin St. John's wort This list may not describe all possible interactions. Give your health care provider a list of all the medicines, herbs, non-prescription drugs, or dietary supplements you use. Also tell them if you smoke, drink alcohol, or use illegal drugs. Some items may interact with your medicine. What should I watch for while using this medication? Visit your care team for regular checks on your progress. Tell your care team if your symptoms do not start to get better or if they get worse. What side effects may I notice from receiving this medication? Side effects that you should report to your care team as soon as possible: Allergic reactions or angioedema--skin rash, itching or hives, swelling of the face, eyes, lips, tongue, arms, or legs, trouble swallowing  or breathing Increase in blood pressure Raynaud syndrome--cool, numb, or painful fingers or toes that may change color from pale, to blue, to red Side effects that usually do not require medical attention (report these to your care team if they continue or are bothersome): Constipation Fatigue Loss of  appetite Nausea This list may not describe all possible side effects. Call your doctor for medical advice about side effects. You may report side effects to FDA at 1-800-FDA-1088. Where should I keep my medication? Keep out of the reach of children and pets. Store at room temperature between 20 and 25 degrees C (68 and 77 degrees F). Get rid of any unused medication after the expiration date. To get rid of medications that are no longer needed or have expired: Take the medication to a medication take-back program. Check with your pharmacy or law enforcement to find a location. If you cannot return the medication, check the label or package insert to see if the medication should be thrown out in the garbage or flushed down the toilet. If you are not sure, ask your care team. If it is safe to put it in the trash, take the medication out of the container. Mix the medication with cat litter, dirt, coffee grounds, or other unwanted substance. Seal the mixture in a bag or container. Put it in the trash. NOTE: This sheet is a summary. It may not cover all possible information. If you have questions about this medicine, talk to your doctor, pharmacist, or health care provider.  2025 Elsevier/Gold Standard (2023-11-27 00:00:00)Rimegepant Disintegrating Tablets What is this medication? RIMEGEPANT (ri ME je pant) prevents and treats migraines. It works by blocking a substance in the body that causes migraines. This medicine may be used for other purposes; ask your health care provider or pharmacist if you have questions. COMMON BRAND NAME(S): NURTEC ODT What should I tell my care team before I take this medication? They need to know if you have any of these conditions: Circulation problems in fingers or toes (Raynaud syndrome) High blood pressure Kidney disease Liver disease An unusual or allergic reaction to rimegepant, other medications, foods, dyes, or preservatives Pregnant or trying to get  pregnant Breastfeeding How should I use this medication? Take this medication by mouth. Take it as directed on the prescription label. Leave the tablet in the sealed pack until you are ready to take it. With dry hands, open the pack and gently remove the tablet. If the tablet breaks or crumbles, throw it away. Use a new tablet. Place the tablet in the mouth and allow it to dissolve. Then, swallow it. Do not cut, crush, or chew this medication. You do not need water to take this medication. Talk to your care team about the use of this medication in children. Special care may be needed. Overdosage: If you think you have taken too much of this medicine contact a poison control center or emergency room at once. NOTE: This medicine is only for you. Do not share this medicine with others. What if I miss a dose? This does not apply. This medication is not for regular use. What may interact with this medication? Certain medications for fungal infections, such as fluconazole, itraconazole Rifampin This list may not describe all possible interactions. Give your health care provider a list of all the medicines, herbs, non-prescription drugs, or dietary supplements you use. Also tell them if you smoke, drink alcohol, or use illegal drugs. Some items may interact with your medicine. What should  I watch for while using this medication? Visit your care team for regular checks on your progress. Tell your care team if your symptoms do not start to get better or if they get worse. What side effects may I notice from receiving this medication? Side effects that you should report to your care team as soon as possible: Allergic reactions or angioedema--skin rash, itching or hives, swelling of the face, eyes, lips, tongue, arms, or legs, trouble swallowing or breathing Increase in blood pressure Raynaud syndrome--cool, numb, or painful fingers or toes that may change color from pale, to blue, to red Side effects that  usually do not require medical attention (report these to your care team if they continue or are bothersome): Nausea Stomach pain This list may not describe all possible side effects. Call your doctor for medical advice about side effects. You may report side effects to FDA at 1-800-FDA-1088. Where should I keep my medication? Keep out of the reach of children and pets. Store at room temperature between 20 and 25 degrees C (68 and 77 degrees F). Get rid of any unused medication after the expiration date. To get rid of medications that are no longer needed or have expired: Take the medication to a medication take-back program. Check with your pharmacy or law enforcement to find a location. If you cannot return the medication, check the label or package insert to see if the medication should be thrown out in the garbage or flushed down the toilet. If you are not sure, ask your care team. If it is safe to put it in the trash, take the medication out of the container. Mix the medication with cat litter, dirt, coffee grounds, or other unwanted substance. Seal the mixture in a bag or container. Put it in the trash. NOTE: This sheet is a summary. It may not cover all possible information. If you have questions about this medicine, talk to your doctor, pharmacist, or health care provider.  2025 Elsevier/Gold Standard (2023-11-27 00:00:00)

## 2024-05-11 ENCOUNTER — Other Ambulatory Visit (HOSPITAL_COMMUNITY): Payer: Self-pay

## 2024-05-11 ENCOUNTER — Telehealth: Payer: Self-pay | Admitting: Pharmacist

## 2024-05-11 DIAGNOSIS — G43711 Chronic migraine without aura, intractable, with status migrainosus: Secondary | ICD-10-CM | POA: Insufficient documentation

## 2024-05-11 DIAGNOSIS — G43101 Migraine with aura, not intractable, with status migrainosus: Secondary | ICD-10-CM | POA: Insufficient documentation

## 2024-05-11 NOTE — Telephone Encounter (Signed)
 Pharmacy Patient Advocate Encounter  Received notification from [facility] that Prior Authorization for QULIPTA  60 MG PO TABS has been APPROVED from 05/11/2024 to 08/10/2024   PA #/Case ID/Reference #: [id number]

## 2024-05-11 NOTE — Telephone Encounter (Signed)
 Pharmacy Patient Advocate Encounter   Received notification from Patient Pharmacy that prior authorization for Qulipta  60MG  tablets is required/requested.   Insurance verification completed.   The patient is insured through [facility] .   Per test claim: PA required; PA submitted to above mentioned insurance via Latent Key/confirmation #/EOC [id number] Status is pending

## 2024-05-30 ENCOUNTER — Other Ambulatory Visit: Payer: Self-pay | Admitting: Neurology

## 2024-06-16 ENCOUNTER — Ambulatory Visit: Payer: Self-pay | Admitting: Neurology

## 2024-08-03 ENCOUNTER — Encounter: Payer: Self-pay | Admitting: Neurology

## 2024-08-03 ENCOUNTER — Ambulatory Visit: Admitting: Neurology

## 2024-08-03 ENCOUNTER — Other Ambulatory Visit: Payer: Self-pay | Admitting: Neurology

## 2024-08-03 ENCOUNTER — Telehealth: Admitting: Neurology

## 2024-08-03 VITALS — BP 135/69 | HR 78 | Ht 64.0 in | Wt 151.0 lb

## 2024-08-03 DIAGNOSIS — N926 Irregular menstruation, unspecified: Secondary | ICD-10-CM | POA: Insufficient documentation

## 2024-08-03 DIAGNOSIS — G43711 Chronic migraine without aura, intractable, with status migrainosus: Secondary | ICD-10-CM | POA: Diagnosis not present

## 2024-08-03 MED ORDER — NURTEC 75 MG PO TBDP: 75.0000 mg | ORAL_TABLET | Freq: Every day | ORAL | 3 refills | Status: AC | PRN

## 2024-08-03 MED ORDER — QULIPTA 60 MG PO TABS: 60.0000 mg | ORAL_TABLET | Freq: Every day | ORAL | 11 refills | Status: AC

## 2024-08-03 MED ORDER — SUMATRIPTAN SUCCINATE 100 MG PO TABS: 100.0000 mg | ORAL_TABLET | ORAL | 6 refills | Status: AC | PRN

## 2024-08-03 NOTE — Patient Instructions (Signed)
 Atogepant  Tablets What is this medication? ATOGEPANT  (a TOE je pant) prevents migraines. It works by blocking a substance in the body that causes migraines. This medicine may be used for other purposes; ask your health care provider or pharmacist if you have questions. COMMON BRAND NAME(S): QULIPTA  What should I tell my care team before I take this medication? They need to know if you have any of these conditions: Circulation problems in fingers or toes (Raynaud syndrome) High blood pressure Kidney disease Liver disease An unusual or allergic reaction to atogepant , other medications, foods, dyes, or preservatives Pregnant or trying to get pregnant Breastfeeding How should I use this medication? Take this medication by mouth with water. Take it as directed on the prescription label at the same time every day. You can take it with or without food. If it upsets your stomach, take it with food. Keep taking it unless your care team tells you to stop. Talk to your care team about the use of this medication in children. Special care may be needed. Overdosage: If you think you have taken too much of this medicine contact a poison control center or emergency room at once. NOTE: This medicine is only for you. Do not share this medicine with others. What if I miss a dose? If you miss a dose, take it as soon as you can. If it is almost time for your next dose, take only that dose. Do not take double or extra doses. What may interact with this medication? Carbamazepine Certain medications for fungal infections, such as itraconazole, ketoconazole Clarithromycin Cyclosporine Efavirenz Etravirine Phenytoin Rifampin St. John's wort This list may not describe all possible interactions. Give your health care provider a list of all the medicines, herbs, non-prescription drugs, or dietary supplements you use. Also tell them if you smoke, drink alcohol, or use illegal drugs. Some items may interact with  your medicine. What should I watch for while using this medication? Visit your care team for regular checks on your progress. Tell your care team if your symptoms do not start to get better or if they get worse. What side effects may I notice from receiving this medication? Side effects that you should report to your care team as soon as possible: Allergic reactions or angioedema--skin rash, itching or hives, swelling of the face, eyes, lips, tongue, arms, or legs, trouble swallowing or breathing Increase in blood pressure Raynaud syndrome--cool, numb, or painful fingers or toes that may change color from pale, to blue, to red Side effects that usually do not require medical attention (report these to your care team if they continue or are bothersome): Constipation Fatigue Loss of appetite Nausea This list may not describe all possible side effects. Call your doctor for medical advice about side effects. You may report side effects to FDA at 1-800-FDA-1088. Where should I keep my medication? Keep out of the reach of children and pets. Store at room temperature between 20 and 25 degrees C (68 and 77 degrees F). Get rid of any unused medication after the expiration date. To get rid of medications that are no longer needed or have expired: Take the medication to a medication take-back program. Check with your pharmacy or law enforcement to find a location. If you cannot return the medication, check the label or package insert to see if the medication should be thrown out in the garbage or flushed down the toilet. If you are not sure, ask your care team. If it is safe to put  it in the trash, take the medication out of the container. Mix the medication with cat litter, dirt, coffee grounds, or other unwanted substance. Seal the mixture in a bag or container. Put it in the trash. NOTE: This sheet is a summary. It may not cover all possible information. If you have questions about this medicine, talk to  your doctor, pharmacist, or health care provider.  2025 Elsevier/Gold Standard (2023-11-27 00:00:00)  TOC from Dr Ines.    Rimegepant Disintegrating Tablets What is this medication? RIMEGEPANT (ri ME je pant) prevents and treats migraines. It works by blocking a substance in the body that causes migraines. This medicine may be used for other purposes; ask your health care provider or pharmacist if you have questions. COMMON BRAND NAME(S): NURTEC ODT What should I tell my care team before I take this medication? They need to know if you have any of these conditions: Circulation problems in fingers or toes (Raynaud syndrome) High blood pressure Kidney disease Liver disease An unusual or allergic reaction to rimegepant, other medications, foods, dyes, or preservatives Pregnant or trying to get pregnant Breastfeeding How should I use this medication? Take this medication by mouth. Take it as directed on the prescription label. Leave the tablet in the sealed pack until you are ready to take it. With dry hands, open the pack and gently remove the tablet. If the tablet breaks or crumbles, throw it away. Use a new tablet. Place the tablet in the mouth and allow it to dissolve. Then, swallow it. Do not cut, crush, or chew this medication. You do not need water to take this medication. Talk to your care team about the use of this medication in children. Special care may be needed. Overdosage: If you think you have taken too much of this medicine contact a poison control center or emergency room at once. NOTE: This medicine is only for you. Do not share this medicine with others. What if I miss a dose? This does not apply. This medication is not for regular use. What may interact with this medication? Certain medications for fungal infections, such as fluconazole, itraconazole Rifampin This list may not describe all possible interactions. Give your health care provider a list of all the  medicines, herbs, non-prescription drugs, or dietary supplements you use. Also tell them if you smoke, drink alcohol, or use illegal drugs. Some items may interact with your medicine. What should I watch for while using this medication? Visit your care team for regular checks on your progress. Tell your care team if your symptoms do not start to get better or if they get worse. What side effects may I notice from receiving this medication? Side effects that you should report to your care team as soon as possible: Allergic reactions or angioedema--skin rash, itching or hives, swelling of the face, eyes, lips, tongue, arms, or legs, trouble swallowing or breathing Increase in blood pressure Raynaud syndrome--cool, numb, or painful fingers or toes that may change color from pale, to blue, to red Side effects that usually do not require medical attention (report these to your care team if they continue or are bothersome): Nausea Stomach pain This list may not describe all possible side effects. Call your doctor for medical advice about side effects. You may report side effects to FDA at 1-800-FDA-1088. Where should I keep my medication? Keep out of the reach of children and pets. Store at room temperature between 20 and 25 degrees C (68 and 77 degrees F). Get  rid of any unused medication after the expiration date. To get rid of medications that are no longer needed or have expired: Take the medication to a medication take-back program. Check with your pharmacy or law enforcement to find a location. If you cannot return the medication, check the label or package insert to see if the medication should be thrown out in the garbage or flushed down the toilet. If you are not sure, ask your care team. If it is safe to put it in the trash, take the medication out of the container. Mix the medication with cat litter, dirt, coffee grounds, or other unwanted substance. Seal the mixture in a bag or container. Put  it in the trash. NOTE: This sheet is a summary. It may not cover all possible information. If you have questions about this medicine, talk to your doctor, pharmacist, or health care provider.  2025 Elsevier/Gold Standard (2023-11-27 00:00:00) Ines,  Assessment is as follows here:  1)  chronic migraines with catamenial component , now have continued while on HRT, HRT helped brain fog.  2) migraine reduced form 15 or more a months to 2 a week now,  still with aura.      Plan:  Treatment plan and additional workup planned after today includes:   1) try one month without HRT.  Keep a headache journal.    2) continue Qulipta  and Nurtec as needed.  If this regimen no longer works, consider vyepti.   3) sumatriptan  refill.  4) no botox  for a year now,  can follow up with  Lauraine Born for regular HA clinic  yearly.    CC Ginnie Pinal, OD.   Yearly follow up with NP.

## 2024-08-03 NOTE — Progress Notes (Signed)
 [facility]  Provider:  Dr Chalice Referring Provider:   WONDA from Dr Ines   Primary Care Physician:  Patient, No Pcp Per  Chief Complaint  Patient presents with   Migraine    Rm 2 alone Pt is well, reports migraines have slightly improved. She currently has about 2 migraines a week.   No new concerns.     HPI:  Bonnie Lester is a 53 y.o. female and seen here on 08/03/2024 upon TOC ,  now having twice a week migraines.  The intensity is educed as well as the frequency, Sumatriptan  and aleve taken at onset for abortive therapy.  Prevention by Qulipta  orally, allows her to eat. This is working for now.    Has failed Emgality,   Last visit it Dr Ahern,Nurtec samples were tried as abortive with Qulipta  and helped.   Depression history, mental fog  and sadness, She is on HRT and in menopause, has not felt better with menopause. Her migraines have not improved, brain fog has improved.     She has a dx of chronic migraines onset at age 17 or 66,  violent, with vomiting.  Hormonal component with menarche, monthly migraines right before period onset.  Occasionally her migraines present with an Visual aura, flickering, fuzzy , foggy vision-  right peripheral vision field loss just before migraines start.  No vertigo  until the pain is present,  off balance, and nauseated for longer than the headaches last .     Mother had migraines, 2 sisters with migraines.  Mother is confabulating a lot, reported that her migraines went away with menopause.    This patient receives Botox  through Dr Ines and Lauraine Born, NP.  Formerly Dr Letcher patient at [facility] in [place].    Review of Systems: Out of a complete 14 system review, the patient complains of only the following symptoms, and all other reviewed systems are negative.    Social History   Socioeconomic History   Marital status: Married    Spouse name: Not on file   Number of children: 2   Years of  education: Not on file   Highest education level: Not on file  Occupational History   Occupation: Firefighter - investment ,  used to be with [employer].   Tobacco Use   Smoking status: Never    Passive exposure: Past   Smokeless tobacco: Never  Vaping Use   Vaping status: Never Used  Substance and Sexual Activity   Alcohol use: Not Currently    Comment: rarely   Drug use: No   Sexual activity: Not on file  Other Topics Concern   Not on file  Social History Narrative   Caffeine: 1 cup/day coffee   Right handed   Social Drivers of Corporate Investment Banker Strain: Not on file  Food Insecurity: No Food Insecurity (10/02/2021)   Received from [facility]   Hunger Vital Sign    Within the past 12 months, you worried that your food would run out before you got the money to buy more.: Never true    Within the past 12 months, the food you bought just didn't last and you didn't have money to get more.: Never true  Transportation Needs: Not on file  Physical Activity: Not on file  Stress: Not on file  Social Connections: Not on file  Intimate Partner Violence: Not on file    Family History  Problem Relation Age of Onset  Diabetes Mother    Migraines Mother    Depression Father    Depression Sister    Heart attack Sister        Cocaine abuse   Cancer Maternal Grandmother     Past Medical History:  Diagnosis Date   Deviated septum    GERD (gastroesophageal reflux disease)    Migraines    guaranteed once/month; sporatic from there (07/17/2016)   Parotid tumor    PONV (postoperative nausea and vomiting)    Prediabetes    Seasonal asthma     Past Surgical History:  Procedure Laterality Date   BREAST REDUCTION SURGERY     COLONOSCOPY  11/2023   ENDOMETRIAL ABLATION  2009   HAND SURGERY Left 2004   nerve repair   INGUINAL HERNIA REPAIR Right 1999   PAROTIDECTOMY Left 07/16/2016   WITH FACIAL NERVE DISSECTION AND NIMS MONITORING    PAROTIDECTOMY Left  07/17/2016   Procedure: LEFT PAROTIDECTOMY WITH FACIAL NERVE DISSECTION AND NIMS MONITORING;  Surgeon: Alm Bouche, MD;  Location: [facility] OR;  Service: ENT;  Laterality: Left;   TUBAL LIGATION  2001    Current Outpatient Medications  Medication Sig Dispense Refill   albuterol  (VENTOLIN  HFA) 108 (90 Base) MCG/ACT inhaler Inhale 2 puffs into the lungs every 6 (six) hours as needed for wheezing or shortness of breath. Inhale two puffs every four to six hours as needed for cough or wheeze. 1 Inhaler 0   Atogepant  (QULIPTA ) 60 MG TABS Take 1 tablet (60 mg total) by mouth daily. Please run copay card: BIN [id number] PCN [id number] [id number] ID [id number] EXP 09/01/2024 30 tablet 11   Cetirizine HCl (ZYRTEC ALLERGY) 10 MG CAPS Take by oral route as needed.     cyclobenzaprine  (FLEXERIL ) 10 MG tablet Take 10 mg by mouth 3 (three) times daily as needed.     Estradiol-Norethindrone Acet (MIMVEY PO) Take 1 tablet by mouth daily.     famotidine  (PEPCID ) 20 MG tablet Take 1 tablet (20 mg total) by mouth 2 (two) times daily. (Patient taking differently: Take 20 mg by mouth 2 (two) times daily. As needed) 30 tablet 0   Lidocaine  (ZTLIDO ) 1.8 % PTCH Apply 2 patches topically daily. 8 patch 0   MAGNESIUM PO Take 200 mg by mouth daily.     naproxen sodium (ALEVE) 220 MG tablet Take 1 tablet by mouth as needed.     promethazine  (PHENERGAN ) 12.5 MG tablet Take 1-2 tablets (12.5-25 mg total) by mouth every 6 (six) hours as needed for nausea or vomiting. 30 tablet 11   riboflavin (VITAMIN B-2) 100 MG TABS tablet Take 1 tablet by mouth daily.     Rimegepant Sulfate (NURTEC) 75 MG TBDP Take 1 tablet (75 mg total) by mouth daily as needed. For migraines. Take as close to onset of migraine as possible. One daily maximum. 16 tablet 0   semaglutide-weight management (WEGOVY) 0.25 MG/0.5ML SOAJ SQ injection Inject 0.25 mg into the skin once a week.     SUMAtriptan  (IMITREX ) 100 MG tablet TAKE 1 TABLET BY MOUTH TWICE A DAY AS  NEEDED FOR MIGRAINE MAY REPEAT IN 2 HOURS IF HEADACHE PERSITS OR RECURS 12 tablet 6   No current facility-administered medications for this visit.    Allergies as of 08/03/2024 - Review Complete 08/03/2024  Allergen Reaction Noted   Epinephrine  Nausea Only and Palpitations 04/13/2021   Penicillins Other (See Comments) 07/09/2016    Vitals: BP 135/69   Pulse 78   Ht 5'  4 (1.626 m)   Wt 151 lb (68.5 kg)   BMI 25.92 kg/m  Physical exam:  General: The patient is awake, alert and appears not in acute distress.  The patient is well groomed. Head: Normocephalic, atraumatic.  Neck is supple.  Cardiovascular:  Regular rate and palpable peripheral pulse:  Respiratory: clear to auscultation.   Skin:  Without evidence of edema, or rash Trunk: BMI 26   Neurologic exam : The patient is awake and alert, oriented to place and time.   Memory subjective  described as intact.  There is a normal attention span & concentration ability.  Speech is fluent without  dysarthria, dysphonia or aphasia.  Mood and affect are depressed  Cranial nerves: Pupils are equal and briskly reactive to light.  Funduscopic exam evidence of pallor or edema.  Extraocular movements  in vertical and horizontal planes intact and without nystagmus. Visual fields by finger perimetry are intact in the left eye, right eye  blind- '. Hearing to finger rub intact.   Facial sensation intact to fine touch. Facial motor strength is symmetric and tongue and uvula move midline.  Motor exam:   Normal tone and normal muscle bulk and symmetric normal strength in all extremities. Grip Strength equal  Proximal strength of shoulder muscles and hip flexors was intact.  Sensory:  Fine touch and vibration were tested .  Proprioception was tested in the upper extremities only and was  normal.  Coordination: Rapid alternating movements in the fingers/hands were normal.  Finger-to-nose maneuver was tested and showed no evidence of  ataxia, dysmetria or tremor.  Gait and station: Patient walked with/ without assistive device .  Core Strength within normal limits.  Stance is stable and of wide/ normal. Base.   Deep tendon reflexes: in the  upper and lower extremities are symmetric and  brisk .    Assessment: Total time for face to face interview and examination, for review of  images and laboratory testing, neurophysiology testing and pre-existing records, including out-of -network , was 35 minutes. Assessment is as follows here:  1)  chronic migraines with catamenial component , now have continued while on HRT, HRT helped brain fog.  2) migraine reduced form 15 or more a months to 2 a week now,  still with aura.      Plan:  Treatment plan and additional workup planned after today includes:   1) try one month without HRT.  Keep a headache journal.    2) continue Qulipta  and Nurtec as needed.  If this regimen no longer works, consider vyepti.   3) sumatriptan  refill.  4) no botox  for a year now,  can follow up with  Lauraine Born for regular [facility] clinic  yearly.    CC Ginnie Pinal, OD.   Yearly follow up with NP.    The patient's condition requires frequent monitoring and adjustments in the treatment plan, reflecting the ongoing complexity of care.  This provider is the continuing focal point for all needed services for this condition.   Dedra Gores, MD  [facility] Board certified by The [employer] of Sleep Medicine and Diplomate of the [employer] of [employer]. Board certified In Neurology through the ABPN, Fellow of the [employer] of [employer].

## 2024-08-06 ENCOUNTER — Other Ambulatory Visit (HOSPITAL_COMMUNITY): Payer: Self-pay

## 2024-08-06 ENCOUNTER — Telehealth: Payer: Self-pay

## 2024-08-06 NOTE — Telephone Encounter (Signed)
 Pharmacy Patient Advocate Encounter   Received notification from RX Request Messages that prior authorization for Nurtec is required/requested.   Insurance verification completed.   The patient is insured through [facility].   Per test claim: PA required; PA submitted to above mentioned insurance via Latent Key/confirmation #/EOC [id number] Status is pending

## 2024-08-06 NOTE — Telephone Encounter (Signed)
 Can PA be initiated please.

## 2024-08-07 ENCOUNTER — Other Ambulatory Visit (HOSPITAL_COMMUNITY): Payer: Self-pay

## 2024-08-09 ENCOUNTER — Other Ambulatory Visit (HOSPITAL_COMMUNITY): Payer: Self-pay

## 2024-08-09 NOTE — Telephone Encounter (Signed)
 Pharmacy Patient Advocate Encounter  Received notification from [facility] that Prior Authorization for Nurtec has been APPROVED from 08/07/2024 to 08/07/2025. Ran test claim, Copay is $0 with copay card. This test claim was processed through [facility]- copay amounts may vary at other pharmacies due to pharmacy/plan contracts, or as the patient moves through the different stages of their insurance plan.   PA #/Case ID/Reference #: [id number]

## 2024-08-11 ENCOUNTER — Telehealth: Payer: Self-pay | Admitting: Pharmacist

## 2024-08-11 NOTE — Telephone Encounter (Signed)
 Pharmacy Patient Advocate Encounter   Received notification from CoverMyMeds that prior authorization for Qulipta  60MG  tablets is required/requested.   Insurance verification completed.   The patient is insured through [facility].   Per test claim: PA required; PA submitted to above mentioned insurance via CoverMyMeds Key/confirmation #/EOC [id number] Status is pending

## 2024-08-12 NOTE — Telephone Encounter (Signed)
 Pharmacy Patient Advocate Encounter  Received notification from [facility] that Prior Authorization for Qulipta  60MG  tablets has been APPROVED from 08/11/2024 to 08/11/2025   PA #/Case ID/Reference #:  [id number]

## 2024-08-19 DIAGNOSIS — Z124 Encounter for screening for malignant neoplasm of cervix: Secondary | ICD-10-CM | POA: Diagnosis not present

## 2024-08-19 DIAGNOSIS — Z1231 Encounter for screening mammogram for malignant neoplasm of breast: Secondary | ICD-10-CM | POA: Diagnosis not present

## 2024-08-19 DIAGNOSIS — Z6825 Body mass index (BMI) 25.0-25.9, adult: Secondary | ICD-10-CM | POA: Diagnosis not present

## 2024-08-19 DIAGNOSIS — Z1151 Encounter for screening for human papillomavirus (HPV): Secondary | ICD-10-CM | POA: Diagnosis not present

## 2024-08-19 DIAGNOSIS — Z01419 Encounter for gynecological examination (general) (routine) without abnormal findings: Secondary | ICD-10-CM | POA: Diagnosis not present

## 2024-08-19 DIAGNOSIS — Z760 Encounter for issue of repeat prescription: Secondary | ICD-10-CM | POA: Diagnosis not present

## 2024-10-04 ENCOUNTER — Other Ambulatory Visit: Payer: Self-pay | Admitting: Neurology

## 2024-10-04 DIAGNOSIS — G43711 Chronic migraine without aura, intractable, with status migrainosus: Secondary | ICD-10-CM

## 2024-10-05 ENCOUNTER — Telehealth: Payer: Self-pay

## 2024-10-05 ENCOUNTER — Other Ambulatory Visit (HOSPITAL_COMMUNITY): Payer: Self-pay

## 2024-10-06 ENCOUNTER — Other Ambulatory Visit (HOSPITAL_COMMUNITY): Payer: Self-pay

## 2024-10-06 NOTE — Telephone Encounter (Signed)
 Pharmacy Patient Advocate Encounter  Received notification from [facility] that Prior Authorization for Nurtec 75MG  dispersible tablets has been APPROVED from 10-05-2024 to 10-05-2025. Ran test claim, Copay is $2,034.04. This test claim was processed through [facility]- copay amounts may vary at other pharmacies due to pharmacy/plan contracts, or as the patient moves through the different stages of their insurance plan.   PA #/Case ID/Reference #: [id number]

## 2025-03-03 ENCOUNTER — Ambulatory Visit: Admitting: Neurology
